# Patient Record
Sex: Female | Born: 1995 | Race: White | Hispanic: Yes | Marital: Single | Smoking: Never smoker
Health system: Southern US, Community
[De-identification: ages and names within clinical notes are randomized; demographics above are authoritative.]

## PROBLEM LIST (undated history)

## (undated) DIAGNOSIS — R109 Unspecified abdominal pain: Secondary | ICD-10-CM

## (undated) HISTORY — DX: Unspecified abdominal pain: R10.9

## (undated) HISTORY — PX: TONSILLECTOMY: SUR1361

---
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
## 2013-01-21 ENCOUNTER — Telehealth: Payer: Self-pay | Admitting: Gastroenterology

## 2013-01-21 NOTE — Telephone Encounter (Signed)
Message copied by Arna Snipe on Wed Jan 21, 2013  2:54 PM ------      Message from: Donata Duff      Created: Wed Dec 24, 2012  9:27 AM       Do not bill ------

## 2013-02-24 ENCOUNTER — Ambulatory Visit (INDEPENDENT_AMBULATORY_CARE_PROVIDER_SITE_OTHER): Payer: BC Managed Care – PPO | Admitting: Family Medicine

## 2013-02-24 ENCOUNTER — Encounter (INDEPENDENT_AMBULATORY_CARE_PROVIDER_SITE_OTHER): Payer: Self-pay

## 2013-02-24 ENCOUNTER — Encounter: Payer: Self-pay | Admitting: Family Medicine

## 2013-02-24 VITALS — BP 98/61 | HR 79 | Temp 98.7°F | Ht 67.0 in | Wt 238.0 lb

## 2013-02-24 DIAGNOSIS — L282 Other prurigo: Secondary | ICD-10-CM

## 2013-02-24 MED ORDER — PREDNISONE 50 MG PO TABS: ORAL_TABLET | ORAL | Status: DC

## 2013-02-24 NOTE — Progress Notes (Signed)
  Subjective:    Patient ID: Peggy Ford, female    DOB: [birth year], 17 y.o.   MRN: [id number]  HPI  This patient complains of a RASH  Location: face/cheek area   Onset: 2-3 days   Course: was out camping 1-2 days prior to onset. Developed facial rash. Pruritic in nature. Had similar episode in the past with outdoor exposure. Denies any direct poison ivy exposure. No prior hx/o rosacea or acne. Prioe hx/o ? Allergies. No known hx/o eczema   Self-treated with: nothing   Improvement with treatment: nothing   History  Itching: yes  Tenderness: no  New medications/antibiotics: no  Pet exposure: no  Recent travel or tropical exposure: no  New soaps, shampoos, detergent, clothing: no  Tick/insect exposure: no  Chemical Exposure: no  Red Flags  Feeling ill: no  Fever: no  Facial/tongue swelling/difficulty breathing: no  Diabetic or immunocompromised: no      Review of Systems  All other systems reviewed and are negative.       Objective:   Physical Exam  Constitutional: She appears well-developed and well-nourished.  HENT:  Head: Normocephalic.    Right Ear: External ear normal.  Left Ear: External ear normal.  Mouth/Throat: Oropharynx is clear and moist.  + faint erythematous rash over bilateral cheeks and R upper eyebrow/eye lid.  Eye full ROM  Vision intact   Eyes: Conjunctivae are normal. Pupils are equal, round, and reactive to light.  Neck: Normal range of motion. Neck supple.  Cardiovascular: Normal rate and regular rhythm.   Pulmonary/Chest: Effort normal.  Abdominal: Soft.  Musculoskeletal: Normal range of motion.  Neurological: She is alert.  Skin: Skin is warm.          Assessment & Plan:  Pruritic rash - Plan: predniSONE (DELTASONE) 50 MG tablet  DDx for rash is fairly broad including contact dermatitis, eczema, acne, rosacea.  Will place on short course of prednisone for treatment Discussed general and derm red flags.  Follow up as needed.

## 2013-08-11 ENCOUNTER — Telehealth: Payer: Self-pay | Admitting: Family Medicine

## 2013-08-11 NOTE — Telephone Encounter (Signed)
appt scheduled on wed with mae

## 2013-08-19 ENCOUNTER — Encounter: Payer: Self-pay | Admitting: General Practice

## 2013-08-19 ENCOUNTER — Ambulatory Visit (INDEPENDENT_AMBULATORY_CARE_PROVIDER_SITE_OTHER): Payer: BC Managed Care – PPO | Admitting: General Practice

## 2013-08-19 ENCOUNTER — Ambulatory Visit (INDEPENDENT_AMBULATORY_CARE_PROVIDER_SITE_OTHER): Payer: BC Managed Care – PPO

## 2013-08-19 VITALS — BP 107/67 | HR 79 | Temp 98.3°F | Ht 67.0 in | Wt 239.4 lb

## 2013-08-19 DIAGNOSIS — R110 Nausea: Secondary | ICD-10-CM

## 2013-08-19 DIAGNOSIS — R109 Unspecified abdominal pain: Secondary | ICD-10-CM

## 2013-08-19 DIAGNOSIS — N390 Urinary tract infection, site not specified: Secondary | ICD-10-CM

## 2013-08-19 DIAGNOSIS — K5900 Constipation, unspecified: Secondary | ICD-10-CM

## 2013-08-19 DIAGNOSIS — R7989 Other specified abnormal findings of blood chemistry: Secondary | ICD-10-CM

## 2013-08-19 LAB — POCT URINALYSIS DIPSTICK
Bilirubin, UA: NEGATIVE
GLUCOSE UA: NEGATIVE
KETONES UA: NEGATIVE
Nitrite, UA: POSITIVE
PROTEIN UA: NEGATIVE
SPEC GRAV UA: 1.01
UROBILINOGEN UA: NEGATIVE
pH, UA: 7.5

## 2013-08-19 LAB — POCT CBC
GRANULOCYTE PERCENT: 65.5 % (ref 37–80)
HCT, POC: 44 % (ref 37.7–47.9)
Hemoglobin: 14.1 g/dL (ref 12.2–16.2)
LYMPH, POC: 3 (ref 0.6–3.4)
MCH, POC: 26.7 pg — AB (ref 27–31.2)
MCHC: 32.1 g/dL (ref 31.8–35.4)
MCV: 83.3 fL (ref 80–97)
MPV: 8.1 fL (ref 0–99.8)
PLATELET COUNT, POC: 295 10*3/uL (ref 142–424)
POC GRANULOCYTE: 6 (ref 2–6.9)
POC LYMPH PERCENT: 32.4 %L (ref 10–50)
RBC: 5.3 M/uL (ref 4.04–5.48)
RDW, POC: 14.4 %
WBC: 9.2 10*3/uL (ref 4.6–10.2)

## 2013-08-19 LAB — POCT UA - MICROSCOPIC ONLY
CASTS, UR, LPF, POC: NEGATIVE
CRYSTALS, UR, HPF, POC: NEGATIVE
MUCUS UA: NEGATIVE
Yeast, UA: NEGATIVE

## 2013-08-19 MED ORDER — NITROFURANTOIN MONOHYD MACRO 100 MG PO CAPS: 100.0000 mg | ORAL_CAPSULE | Freq: Two times a day (BID) | ORAL | Status: DC

## 2013-08-19 NOTE — Patient Instructions (Signed)
Urinary Tract Infection  Urinary tract infections (UTIs) can develop anywhere along your urinary tract. Your urinary tract is your body's drainage system for removing wastes and extra water. Your urinary tract includes two kidneys, two ureters, a bladder, and a urethra. Your kidneys are a pair of bean-shaped organs. Each kidney is about the size of your fist. They are located below your ribs, one on each side of your spine.  CAUSES  Infections are caused by microbes, which are microscopic organisms, including fungi, viruses, and bacteria. These organisms are so small that they can only be seen through a microscope. Bacteria are the microbes that most commonly cause UTIs.  SYMPTOMS   Symptoms of UTIs may vary by age and gender of the patient and by the location of the infection. Symptoms in young women typically include a frequent and intense urge to urinate and a painful, burning feeling in the bladder or urethra during urination. Older women and men are more likely to be tired, shaky, and weak and have muscle aches and abdominal pain. A fever may mean the infection is in your kidneys. Other symptoms of a kidney infection include pain in your back or sides below the ribs, nausea, and vomiting.  DIAGNOSIS  To diagnose a UTI, your caregiver will ask you about your symptoms. Your caregiver also will ask to provide a urine sample. The urine sample will be tested for bacteria and white blood cells. White blood cells are made by your body to help fight infection.  TREATMENT   Typically, UTIs can be treated with medication. Because most UTIs are caused by a bacterial infection, they usually can be treated with the use of antibiotics. The choice of antibiotic and length of treatment depend on your symptoms and the type of bacteria causing your infection.  HOME CARE INSTRUCTIONS   If you were prescribed antibiotics, take them exactly as your caregiver instructs you. Finish the medication even if you feel better after you  have only taken some of the medication.   Drink enough water and fluids to keep your urine clear or pale yellow.   Avoid caffeine, tea, and carbonated beverages. They tend to irritate your bladder.   Empty your bladder often. Avoid holding urine for long periods of time.   Empty your bladder before and after sexual intercourse.   After a bowel movement, women should cleanse from front to back. Use each tissue only once.  SEEK MEDICAL CARE IF:    You have back pain.   You develop a fever.   Your symptoms do not begin to resolve within 3 days.  SEEK IMMEDIATE MEDICAL CARE IF:    You have severe back pain or lower abdominal pain.   You develop chills.   You have nausea or vomiting.   You have continued burning or discomfort with urination.  MAKE SURE YOU:    Understand these instructions.   Will watch your condition.   Will get help right away if you are not doing well or get worse.  Document Released: 01/31/2005 Document Revised: 10/23/2011 Document Reviewed: 06/01/2011  Exit[employer] Patient Information 2014 [employer].

## 2013-08-19 NOTE — Progress Notes (Signed)
Subjective:    Patient ID: Peggy Ford, female    DOB: 14-Jul-1995, 18 y.o.   MRN: [id number]  HPI Patient presents today with complaints of right side pain, nausea, diarrhea, heartburn. History of same symptoms and seen by GI specialists. She is accompanied by her mother. Denies being seen by GI since 2014. Mother to call and inform worsening of symptoms. Last menstrual cycle was August 02, 2013 and regular.     Review of Systems  Constitutional: Negative for fever and chills.  Respiratory: Negative for chest tightness and shortness of breath.   Cardiovascular: Negative for chest pain and palpitations.  Gastrointestinal: Positive for nausea and diarrhea. Negative for vomiting and abdominal pain.       Objective:   Physical Exam  Constitutional: She is oriented to person, place, and time. She appears well-developed and well-nourished.  Cardiovascular: Normal rate, regular rhythm and normal heart sounds.   Pulmonary/Chest: Effort normal and breath sounds normal. No respiratory distress. She exhibits no tenderness.  Abdominal: Soft. Bowel sounds are normal. She exhibits no distension. There is no tenderness.  Neurological: She is alert and oriented to person, place, and time.  Skin: Skin is warm and dry.  Psychiatric: She has a normal mood and affect.    [facility] reading (PRIMARY) by Erby Pian, FNP-C, moderate amount of stool noted in colon.   Results for orders placed in visit on 08/19/13  POCT UA - MICROSCOPIC ONLY      Result Value Ref Range   WBC, Ur, HPF, POC 15-20     RBC, urine, microscopic 10-15     Bacteria, U Microscopic mod     Mucus, UA neg     Epithelial cells, urine per micros occ     Crystals, Ur, HPF, POC neg     Casts, Ur, LPF, POC neg     Yeast, UA neg    POCT URINALYSIS DIPSTICK      Result Value Ref Range   Color, UA yellow     Clarity, UA cloudy     Glucose, UA neg     Bilirubin, UA neg     Ketones, UA neg     Spec Grav, UA 1.010     Blood,  UA mod     pH, UA 7.5     Protein, UA neg     Urobilinogen, UA negative     Nitrite, UA pos     Leukocytes, UA moderate (2+)    POCT CBC      Result Value Ref Range   WBC 9.2  4.6 - 10.2 K/uL   Lymph, poc 3.0  0.6 - 3.4   POC LYMPH PERCENT 32.4  10 - 50 %L   POC Granulocyte 6.0  2 - 6.9   Granulocyte percent 65.5  37 - 80 %G   RBC 5.3  4.04 - 5.48 M/uL   Hemoglobin 14.1  12.2 - 16.2 g/dL   HCT, POC 44.0  37.7 - 47.9 %   MCV 83.3  80 - 97 fL   MCH, POC 26.7 (*) 27 - 31.2 pg   MCHC 32.1  31.8 - 35.4 g/dL   RDW, POC 14.4     Platelet Count, POC 295.0  142 - 424 K/uL   MPV 8.1  0 - 99.8 fL       Assessment & Plan:  1. Side pain  - POCT CBC - DG Abd 1 View; Future  2. Abnormal CBC   3. Nausea  alone  - POCT UA - Microscopic Only - POCT urinalysis dipstick  4. Constipation -Miralax 17grams daily, for 1-4 days, until bowel movement  Increase fluid intake (water) Increase fiber in diet (fruits, vegetables, whole grains) Take stool softner daily   5. UTI (urinary tract infection)  - nitrofurantoin, macrocrystal-monohydrate, (MACROBID) 100 MG capsule; Take 1 capsule (100 mg total) by mouth 2 (two) times daily.  Dispense: 20 capsule; Refill: 0 Increase fluid intake AZO over the counter X2 days Frequent voiding Proper perineal hygiene RTO prn Patient verbalized understanding Erby Pian, FNP-C

## 2014-05-24 ENCOUNTER — Ambulatory Visit (INDEPENDENT_AMBULATORY_CARE_PROVIDER_SITE_OTHER): Payer: BLUE CROSS/BLUE SHIELD | Admitting: Family Medicine

## 2014-05-24 ENCOUNTER — Encounter: Payer: Self-pay | Admitting: Family Medicine

## 2014-05-24 VITALS — BP 112/70 | HR 93 | Temp 97.5°F | Ht 67.0 in | Wt 250.6 lb

## 2014-05-24 DIAGNOSIS — J029 Acute pharyngitis, unspecified: Secondary | ICD-10-CM

## 2014-05-24 LAB — POCT RAPID STREP A (OFFICE): RAPID STREP A SCREEN: NEGATIVE

## 2014-05-24 NOTE — Progress Notes (Signed)
   Subjective:    Patient ID: Peggy Ford, female    DOB: 24-Sep-1995, 19 y.o.   MRN: [id number]  HPI  Patient is here today for a sore throat that started on 2 days ago.  She has also had fever and headache. Frontal HA. Pressure in ears. Denies cough, sputum and dyspnea.   There is mild nasal congestion. The sore thoat hurts with swallow. Moderate severity.   Review of Systems  Constitutional: Negative for fever, chills, diaphoresis, appetite change and fatigue.  HENT: Positive for ear pain and trouble swallowing. Negative for congestion, hearing loss, postnasal drip and rhinorrhea.   Respiratory: Negative for cough, chest tightness and shortness of breath.   Cardiovascular: Negative for chest pain and palpitations.  Gastrointestinal: Negative for abdominal pain.  Musculoskeletal: Negative for arthralgias.  Skin: Negative for rash.       Objective:   Physical Exam  Constitutional: She is oriented to person, place, and time. She appears well-developed and well-nourished. No distress.  HENT:  Head: Normocephalic and atraumatic.  Right Ear: Tympanic membrane and external ear normal. No decreased hearing is noted.  Left Ear: Tympanic membrane and external ear normal. No decreased hearing is noted.  Nose: Nose normal. Right sinus exhibits no frontal sinus tenderness. Left sinus exhibits no frontal sinus tenderness.  Mouth/Throat: Oropharynx is clear and moist. No oropharyngeal exudate or posterior oropharyngeal erythema.  Eyes: Conjunctivae and EOM are normal. Pupils are equal, round, and reactive to light.  Neck: Normal range of motion. Neck supple. No Brudzinski's sign noted. No thyromegaly present.  Cardiovascular: Normal rate, regular rhythm and normal heart sounds.   No murmur heard. Pulmonary/Chest: Effort normal and breath sounds normal. No respiratory distress. She has no wheezes. She has no rales.  Abdominal: Soft. Bowel sounds are normal. She exhibits no distension. There is  no tenderness.  Lymphadenopathy:       Head (right side): No preauricular adenopathy present.       Head (left side): No preauricular adenopathy present.    She has no cervical adenopathy.       Right cervical: No superficial cervical adenopathy present.      Left cervical: No superficial cervical adenopathy present.  Neurological: She is alert and oriented to person, place, and time. She has normal reflexes.  Skin: Skin is warm and dry.  Psychiatric: She has a normal mood and affect. Her behavior is normal. Judgment and thought content normal.   BP 112/70 mmHg  Pulse 93  Temp(Src) 97.5 F (36.4 C) (Oral)  Ht 5\' 7"  (1.702 m)  Wt 250 lb 9.6 oz (113.671 kg)  BMI 39.24 kg/m2  LMP 05/10/2014        Assessment & Plan:  Sore throat - Plan: POCT rapid strep A  Acute pharyngitis, unspecified pharyngitis type  Throat culture ordered. Instruction for care given.

## 2014-05-24 NOTE — Patient Instructions (Signed)

## 2014-05-26 LAB — UPPER RESPIRATORY CULTURE, ROUTINE

## 2015-07-05 IMAGING — NM NM HEPATO W/GB/PHARM/[PERSON_NAME]
2 series · 12 of 12 positions shown · non-contrast
Comparison: 10/28/2012

CLINICAL DATA: Right upper quadrant pain

NUCLEAR MEDICINE HEPATOBILIARY IMAGING WITH GALLBLADDER EF
TECHNIQUE: Sequential images of the abdomen were obtained [DATE] minutes following intravenous administration of
radiopharmaceutical.  After slow intravenous infusion of
micrograms Cholecystokinin, gallbladder ejection fraction was
determined.
Radiopharmaceutical:  5.5 mCi Nc-11m Choletec

[Series 1: he hepato · 4.75mm/px · 6 of 60 frames shown (1 of 2)]
[frame 6/60]
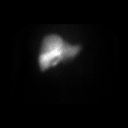
[frame 16/60]
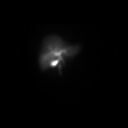
[frame 26/60]
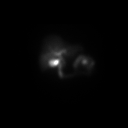
[frame 36/60]
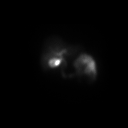
[frame 46/60]
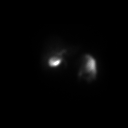
[frame 56/60]
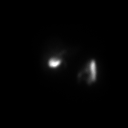

[Series 1: he hepato · 4.75mm/px · 6 of 30 frames shown (2 of 2)]
[frame 3/30]
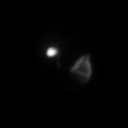
[frame 8/30]
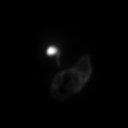
[frame 13/30]
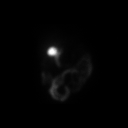
[frame 18/30]
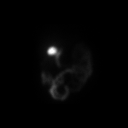
[frame 23/30]
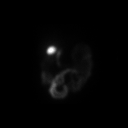
[frame 28/30]
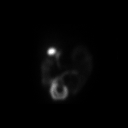

[12 of 12 positions shown; findings below may reference images not displayed]

FINDINGS: Normal hepatic uptake and excretion of the radiotracer
into the biliary tract.  Gallbladder is well visualized beginning
at 10 minutes.  Activity progresses into the small bowel.
Therefore, the cystic duct and common bile duct are patent.  No
biliary obstruction.

CCK administered for gallbladder stimulation.  Ejection fraction
calculated after 30 minutes measuring 51%, which is within normal
limits.

The patient did not experience symptoms during CCK infusion.
IMPRESSION: Normal hepatobiliary scan

## 2015-09-20 ENCOUNTER — Other Ambulatory Visit (INDEPENDENT_AMBULATORY_CARE_PROVIDER_SITE_OTHER): Payer: BLUE CROSS/BLUE SHIELD

## 2015-09-20 ENCOUNTER — Ambulatory Visit (INDEPENDENT_AMBULATORY_CARE_PROVIDER_SITE_OTHER): Payer: BLUE CROSS/BLUE SHIELD | Admitting: Gastroenterology

## 2015-09-20 ENCOUNTER — Encounter: Payer: Self-pay | Admitting: Gastroenterology

## 2015-09-20 VITALS — BP 98/66 | HR 92 | Ht 67.0 in | Wt 260.1 lb

## 2015-09-20 DIAGNOSIS — R109 Unspecified abdominal pain: Secondary | ICD-10-CM | POA: Diagnosis not present

## 2015-09-20 LAB — TSH: TSH: 2.33 u[IU]/mL (ref 0.35–4.50)

## 2015-09-20 LAB — COMPREHENSIVE METABOLIC PANEL
ALBUMIN: 4.5 g/dL (ref 3.5–5.2)
ALK PHOS: 78 U/L (ref 39–117)
ALT: 11 U/L (ref 0–35)
AST: 13 U/L (ref 0–37)
BILIRUBIN TOTAL: 0.4 mg/dL (ref 0.2–1.2)
BUN: 9 mg/dL (ref 6–23)
CO2: 27 mEq/L (ref 19–32)
Calcium: 9.4 mg/dL (ref 8.4–10.5)
Chloride: 103 mEq/L (ref 96–112)
Creatinine, Ser: 0.71 mg/dL (ref 0.40–1.20)
GFR: 109.97 mL/min (ref >60.00)
GLUCOSE: 95 mg/dL (ref 70–99)
Potassium: 4.2 mEq/L (ref 3.5–5.1)
SODIUM: 137 meq/L (ref 135–145)
TOTAL PROTEIN: 7.7 g/dL (ref 6.0–8.3)

## 2015-09-20 LAB — CBC WITH DIFFERENTIAL/PLATELET
BASOS ABS: 0.1 10*3/uL (ref 0.0–0.1)
Basophils Relative: 0.5 % (ref 0.0–3.0)
EOS ABS: 0.2 10*3/uL (ref 0.0–0.7)
Eosinophils Relative: 1 % (ref 0.0–5.0)
HCT: 39.4 % (ref 36.0–46.0)
Hemoglobin: 13.1 g/dL (ref 12.0–15.0)
LYMPHS ABS: 4.2 10*3/uL — AB (ref 0.7–4.0)
Lymphocytes Relative: 28.7 % (ref 12.0–46.0)
MCHC: 33.4 g/dL (ref 30.0–36.0)
MCV: 82.7 fl (ref 78.0–100.0)
MONO ABS: 1.1 10*3/uL — AB (ref 0.1–1.0)
Monocytes Relative: 7.1 % (ref 3.0–12.0)
NEUTROS PCT: 62.7 % (ref 43.0–77.0)
Neutro Abs: 9.3 10*3/uL — ABNORMAL HIGH (ref 1.4–7.7)
Platelets: 378 10*3/uL (ref 150.0–400.0)
RBC: 4.76 Mil/uL (ref 3.87–5.11)
RDW: 14.7 % (ref 11.5–15.5)

## 2015-09-20 LAB — IGA: IGA: 135 mg/dL (ref 68–378)

## 2015-09-20 NOTE — Patient Instructions (Addendum)
You will have labs checked today in the basement lab.  Please head down after you check out with the front desk  (cmet, cbc, tsh, total IgA level, IgA related tTG). You will be set up for a CT scan of abdomen and pelvis with IV and oral contrast (for upper and lower abdominal pains). You have been scheduled for a CT scan of the abdomen and pelvis at [place] ([address]---this is in the same building as Press [facility]).   You are scheduled on 09/21/15 at 1130 am. You should arrive 15 minutes prior to your appointment time for registration. Please follow the written instructions below on the day of your exam:  WARNING: IF YOU ARE ALLERGIC TO IODINE/X-RAY DYE, PLEASE NOTIFY RADIOLOGY IMMEDIATELY AT [phone]! YOU WILL BE GIVEN A 13 HOUR PREMEDICATION PREP.  1) Do not eat or drink anything after 730 am (4 hours prior to your test) 2) You have been given 2 bottles of oral contrast to drink. The solution may taste better if refrigerated, but do NOT add ice or any other liquid to this solution. Shake well before drinking.    Drink 1 bottle of contrast @ 930 am (2 hours prior to your exam)  Drink 1 bottle of contrast @ 1030 am (1 hour prior to your exam)  You may take any medications as prescribed with a small amount of water except for the following: Metformin, Glucophage, Glucovance, Avandamet, Riomet, Fortamet, Actoplus Met, Janumet, Glumetza or Metaglip. The above medications must be held the day of the exam AND 48 hours after the exam.  The purpose of you drinking the oral contrast is to aid in the visualization of your intestinal tract. The contrast solution may cause some diarrhea. Before your exam is started, you will be given a small amount of fluid to drink. Depending on your individual set of symptoms, you may also receive an intravenous injection of x-ray contrast/dye. Plan on being at [facility] for 30 minutes or longer, depending on the type of exam you are  having performed.  This test typically takes 30-45 minutes to complete.  If you have any questions regarding your exam or if you need to reschedule, you may call the CT department at [phone] between the hours of 8:00 am and 5:00 pm, Monday-Friday.  ________________________________________________________________________

## 2015-09-20 NOTE — Progress Notes (Signed)
Review of pertinent gastrointestinal problems: 1. Epig, RUQ pains: 2014: EXTREME NSAID USE (30 ibuprofen daily); EGD 10/2012 Dr. Ardis Hughs was normal;  [place] 11/2012 was essentially normal. HIDA 11/2012 was normal.   HPI: This is a   pleasant 20 year old woman    who was referred to me by Chipper Herb, MD  to evaluate  upper, lower abdominal pains, nausea, diarrhea .    Chief complaint is upper, lower abdominal pains, nausea, diarrhea  She has flares of pains in lower abd, pains in RUQ.  These come on usually after red meats, oily foods.  Avoiding those foods would be helpful.  Usually every 2-3 weeks or so.  She can have nausea at the same time. Was taking OTC ranitidine was taking this several times per day.   Also complains of diarrhea, this usually helped her pains.  This depends on diet.  Usually twice per week.  Oily, greasy foods make it worse.  Steady weight.  Takes only tylenol.  mentral cramps have improved.  These are the same pains as 3 years ago.  Just occuring more often and lasting longer.   Review of systems: Pertinent positive and negative review of systems were noted in the above HPI section. Complete review of systems was performed and was otherwise normal.   History reviewed. No pertinent past medical history.  Past Surgical History  Procedure Laterality Date  . Toenail excision  2010  . Tonsillectomy      No current outpatient prescriptions on file.   No current facility-administered medications for this visit.    Allergies as of 09/20/2015  . (No Known Allergies)    Family History  Problem Relation Age of Onset  . Heart disease      great gm  . Diabetes      great gf  . Colon cancer      great gm  . Irritable bowel syndrome Maternal Grandmother   . Diverticulosis Paternal Grandfather     Social History   Social History  . Marital Status: Single    Spouse Name: N/A  . Number of Children: N/A  . Years of Education: N/A   Occupational  History  . Not on file.   Social History Main Topics  . Smoking status: Never Smoker   . Smokeless tobacco: Never Used  . Alcohol Use: No  . Drug Use: No  . Sexual Activity: Not on file   Other Topics Concern  . Not on file   Social History Narrative     Physical Exam: BP 98/66 mmHg  Pulse 92  Ht 5\' 7"  (1.702 m)  Wt 260 lb 2 oz (117.992 kg)  BMI 40.73 kg/m2  LMP 08/30/2015 Constitutional: generally well-appearing Psychiatric: alert and oriented x3 Eyes: extraocular movements intact Mouth: oral pharynx moist, no lesions Neck: supple no lymphadenopathy Cardiovascular: heart regular rate and rhythm Lungs: clear to auscultation bilaterally Abdomen: soft, nontender, nondistended, no obvious ascites, no peritoneal signs, normal bowel sounds Extremities: no lower extremity edema bilaterally Skin: no lesions on visible extremities   Assessment and plan: 20 y.o. female with  Chronic upper, lower abdominal pains, nausea, diarrhea  Unclear etiology. She tells me these at the same symptoms that she was having 3-4 years ago during which she had ultrasound, HIDA scan, upper endoscopy without clear etiology. At that time she was taking an extraordinary amount of NSAIDs on a nearly daily basis. I recommended stopping NSAIDs. She did in her pains improved bit but seem to have recurred  and are more intense now. I sense that this is probably functional like to get some blood tests, CT scan abdomen and pelvis for her upper and lower abdominal pains. She might need colonoscopy if the above testing is not helpful.   Owens Loffler, MD [facility] 09/20/2015, 3:43 PM  Cc: Chipper Herb, MD

## 2015-09-21 ENCOUNTER — Ambulatory Visit (INDEPENDENT_AMBULATORY_CARE_PROVIDER_SITE_OTHER)
Admission: RE | Admit: 2015-09-21 | Discharge: 2015-09-21 | Disposition: A | Discharge status: Home / Self Care | Payer: BLUE CROSS/BLUE SHIELD | Source: Ambulatory Visit | Attending: Gastroenterology | Admitting: Gastroenterology

## 2015-09-21 DIAGNOSIS — R109 Unspecified abdominal pain: Secondary | ICD-10-CM | POA: Diagnosis not present

## 2015-09-21 LAB — TISSUE TRANSGLUTAMINASE, IGA: Tissue Transglutaminase Ab, IgA: 1 U/mL (ref <4)

## 2015-09-21 MED ORDER — IOPAMIDOL (ISOVUE-300) INJECTION 61%
100.0000 mL | Freq: Once | INTRAVENOUS | Status: AC | PRN
  Administered 2015-09-21: 100 mL via INTRAVENOUS

## 2015-10-14 ENCOUNTER — Ambulatory Visit (AMBULATORY_SURGERY_CENTER): Payer: Self-pay | Admitting: *Deleted

## 2015-10-14 ENCOUNTER — Encounter: Payer: Self-pay | Admitting: Gastroenterology

## 2015-10-14 VITALS — Ht 67.0 in | Wt 261.6 lb

## 2015-10-14 DIAGNOSIS — R109 Unspecified abdominal pain: Secondary | ICD-10-CM

## 2015-10-14 MED ORDER — SUPREP BOWEL PREP KIT 17.5-3.13-1.6 GM/177ML PO SOLN: 1.0000 | Freq: Once | ORAL | Status: DC

## 2015-10-14 NOTE — Progress Notes (Signed)
Patient denies any allergies to egg or soy products. Patient denies complications with anesthesia/sedation.  Patient denies oxygen use at home and denies diet medications. Emmi instructions for colonoscopy/endoscopy explained but patient denied.  Pamphlet given.

## 2015-10-19 ENCOUNTER — Ambulatory Visit (AMBULATORY_SURGERY_CENTER): Payer: BLUE CROSS/BLUE SHIELD | Admitting: Gastroenterology

## 2015-10-19 ENCOUNTER — Encounter: Payer: Self-pay | Admitting: Gastroenterology

## 2015-10-19 VITALS — BP 105/59 | HR 69 | Temp 98.5°F | Resp 26 | Ht 67.0 in | Wt 261.0 lb

## 2015-10-19 DIAGNOSIS — R197 Diarrhea, unspecified: Secondary | ICD-10-CM

## 2015-10-19 DIAGNOSIS — D123 Benign neoplasm of transverse colon: Secondary | ICD-10-CM

## 2015-10-19 DIAGNOSIS — R109 Unspecified abdominal pain: Secondary | ICD-10-CM

## 2015-10-19 MED ORDER — SODIUM CHLORIDE 0.9 % IV SOLN: 500.0000 mL | INTRAVENOUS | Status: DC

## 2015-10-19 NOTE — Progress Notes (Signed)
Called to room to assist during endoscopic procedure.  Patient ID and intended procedure confirmed with present staff. Received instructions for my participation in the procedure from the performing physician.  

## 2015-10-19 NOTE — Progress Notes (Signed)
To pacu vss patent aw reprot to rn 

## 2015-10-19 NOTE — Patient Instructions (Signed)

## 2015-10-19 NOTE — Op Note (Signed)
[place] Patient Name: Peggy Ford Procedure Date: 10/19/2015 1:54 PM MRN: [id number] Endoscopist: [name] , MD Age: 20 Referring MD:  Date of Birth: 06/12/1995 Gender: Female Procedure:                Upper GI endoscopy Indications:              Epigastric abdominal pain Medicines:                Monitored Anesthesia Care Procedure:                Pre-Anesthesia Assessment:                           - Prior to the procedure, a History and Physical                            was performed, and patient medications and                            allergies were reviewed. The patient's tolerance of                            previous anesthesia was also reviewed. The risks                            and benefits of the procedure and the sedation                            options and risks were discussed with the patient.                            All questions were answered, and informed consent                            was obtained. Prior Anticoagulants: The patient has                            taken no previous anticoagulant or antiplatelet                            agents. ASA Grade Assessment: II - A patient with                            mild systemic disease. After reviewing the risks                            and benefits, the patient was deemed in                            satisfactory condition to undergo the procedure.                           After obtaining informed consent, the endoscope was  passed under direct vision. Throughout the                            procedure, the patient's blood pressure, pulse, and                            oxygen saturations were monitored continuously. The                            Model GIF-[id number]) scope was introduced                            through the mouth, and advanced to the second part                            of duodenum. The upper GI endoscopy was                     accomplished without difficulty. The patient                            tolerated the procedure well. Scope In: Scope Out: Findings:                 The esophagus was normal.                           The stomach was normal.                           The examined duodenum was normal. Complications:            No immediate complications. Estimated blood loss:                            None. Estimated Blood Loss:     Estimated blood loss: none. Impression:               - Normal esophagus.                           - Normal stomach.                           - Normal examined duodenum.                           - No specimens collected. Recommendation:           - Patient has a contact number available for                            emergencies. The signs and symptoms of potential                            delayed complications were discussed with the                            patient. Return to normal activities  tomorrow.                            Written discharge instructions were provided to the                            patient.                           - Resume previous diet.                           - Continue present medications.                           - No repeat upper endoscopy.                           - Await pathology from recent colonoscopy biopsies. [name], MD 10/19/2015 2:19:49 PM This report has been signed electronically.

## 2015-10-19 NOTE — Op Note (Signed)
[place] Patient Name: Peggy Ford Procedure Date: 10/19/2015 1:59 PM MRN: [id number] Endoscopist: [name] , MD Age: 20 Referring MD:  Date of Birth: 12/30/1995 Gender: Female Procedure:                Colonoscopy Indications:              Clinically significant diarrhea of unexplained                            origin, abdominal pains Medicines:                Monitored Anesthesia Care Procedure:                Pre-Anesthesia Assessment:                           - Prior to the procedure, a History and Physical                            was performed, and patient medications and                            allergies were reviewed. The patient's tolerance of                            previous anesthesia was also reviewed. The risks                            and benefits of the procedure and the sedation                            options and risks were discussed with the patient.                            All questions were answered, and informed consent                            was obtained. Prior Anticoagulants: The patient has                            taken no previous anticoagulant or antiplatelet                            agents. ASA Grade Assessment: II - A patient with                            mild systemic disease. After reviewing the risks                            and benefits, the patient was deemed in                            satisfactory condition to undergo the procedure.  After obtaining informed consent, the colonoscope                            was passed under direct vision. Throughout the                            procedure, the patient's blood pressure, pulse, and                            oxygen saturations were monitored continuously. The                            Model CF-HQ190L [id number]) scope was introduced                            through the anus and advanced to the the terminal                    ileum. The colonoscopy was performed without                            difficulty. The patient tolerated the procedure                            well. The quality of the bowel preparation was                            excellent. The terminal ileum, ileocecal valve,                            appendiceal orifice, and rectum were photographed. Scope In: 2:02:38 PM Scope Out: 2:10:56 PM Scope Withdrawal Time: 0 hours 5 minutes 56 seconds  Total Procedure Duration: 0 hours 8 minutes 18 seconds  Findings:                 The perianal and digital rectal examinations were                            normal.                           A 3 mm polyp was found in the transverse colon. The                            polyp was sessile. The polyp was removed with a                            cold snare. Resection and retrieval were complete.                           The exam was otherwise without abnormality on                            direct and retroflexion views.  Biopsies for histology were taken with a cold                            forceps from the entire colon for evaluation of                            microscopic colitis. Complications:            No immediate complications. Estimated blood loss:                            None. Estimated Blood Loss:     Estimated blood loss: none. Impression:               - One 3 mm polyp in the transverse colon, removed                            with a cold snare. Resected and retrieved.                           - The examination was otherwise normal on direct                            and retroflexion views.                           - Biopsies were taken with a cold forceps from the                            entire colon for evaluation of microscopic colitis. Recommendation:           - Patient has a contact number available for                            emergencies. The signs and symptoms of potential                             delayed complications were discussed with the                            patient. Return to normal activities tomorrow.                            Written discharge instructions were provided to the                            patient.                           - Resume previous diet.                           - Continue present medications.                           - Await pathology results. [name], MD 10/19/2015 2:14:07  PM This report has been signed electronically.

## 2015-10-20 ENCOUNTER — Telehealth: Payer: Self-pay

## 2015-10-20 NOTE — Telephone Encounter (Signed)
Left message on answering machine. 

## 2015-10-25 ENCOUNTER — Encounter: Payer: Self-pay | Admitting: Gastroenterology

## 2016-01-26 ENCOUNTER — Ambulatory Visit (INDEPENDENT_AMBULATORY_CARE_PROVIDER_SITE_OTHER): Payer: BLUE CROSS/BLUE SHIELD | Admitting: Family Medicine

## 2016-01-26 ENCOUNTER — Encounter: Payer: Self-pay | Admitting: Family Medicine

## 2016-01-26 DIAGNOSIS — F329 Major depressive disorder, single episode, unspecified: Secondary | ICD-10-CM | POA: Diagnosis not present

## 2016-01-26 DIAGNOSIS — F418 Other specified anxiety disorders: Secondary | ICD-10-CM | POA: Insufficient documentation

## 2016-01-26 DIAGNOSIS — F32A Depression, unspecified: Secondary | ICD-10-CM

## 2016-01-26 MED ORDER — CITALOPRAM HYDROBROMIDE 20 MG PO TABS: 20.0000 mg | ORAL_TABLET | Freq: Every day | ORAL | 3 refills | Status: DC

## 2016-01-26 NOTE — Progress Notes (Signed)
   HPI  Patient presents today here with depression.  Patient explains that she's had steadily worsening symptoms over the last 1 year.  She has primarily feeling down and depressed, anhedonia, trouble falling asleep and then sleeping over 12 hours with continued fatigue, or appetite, and passive thoughts of being better off dead.  She states that she is not planning to hurt herself and would not hurt herself, however she has considered cutting herself with a razor blade.  She contracts for safety.  She has tried counseling with not much improvement, she would like to try medications.  PMH: Smoking status noted ROS: Per HPI  Objective: BP (!) 127/91   Pulse (!) 106   Temp 98.9 F (37.2 C) (Oral)   Ht 5\' 7"  (1.702 m)   Wt 250 lb 6.4 oz (113.6 kg)   BMI 39.22 kg/m  Gen: NAD, alert, cooperative with exam HEENT: NCAT, EOMI, PERRL Abd: SNTND, BS present, no guarding or organomegaly Ext: No edema, warm Neuro: Alert and oriented, No gross deficits  Psych: His affect, denies current suicidal thoughts but does endorse feelings of being better off dead occasionally, states that she would not hurt herself and she contracts for safety.  Depression screen PHQ 2/9 01/26/2016  Decreased Interest 2  Down, Depressed, Hopeless 2  PHQ - 2 Score 4  Altered sleeping 3  Tired, decreased energy 2  Change in appetite 2  Feeling bad or failure about yourself  3  Trouble concentrating 1  Moving slowly or fidgety/restless 1  Suicidal thoughts 2  PHQ-9 Score 18     Assessment and plan:  # Depression Significant depression for one year Start citalopram, considred wellbutrin but pt has too much anxiety and I believe this may exacerbate her symps Encouraged patient to pursue CBT. Recommended 3 to four-week follow-up, patient contracts for safety   Meds ordered this encounter  Medications  . etonogestrel (NEXPLANON) 68 MG IMPL implant    Sig: 1 each by Subdermal route once.  .  citalopram (CELEXA) 20 MG tablet    Sig: Take 1 tablet (20 mg total) by mouth daily.    Dispense:  30 tablet    Refill:  [place], MD [facility] 01/26/2016, 3:10 PM

## 2016-01-26 NOTE — Patient Instructions (Addendum)
Great to meet you!  I have started yo uon citalopram for depression, it will also help with anxiety.   I think you would get benefit from Cognitive behavioral therapy (CBT), Look at [email or web address] for local therapists/psychologists   Taking the medicine as directed and not missing any doses is one of the best things you can do to treat your depression.  Here are some things to keep in mind:  1) Side effects (stomach upset, some increased anxiety) may happen before you notice a benefit.  These side effects typically go away over time. 2) Changes to your dose of medicine or a change in medication all together is sometimes necessary 3) Most people need to be on medication at least 6-12 months 4) Many people will notice an improvement within two weeks but the full effect of the medication can take up to 4-6 weeks 5) Stopping the medication when you start feeling better often results in a return of symptoms 6) If you start having thoughts of hurting yourself or others after starting this medicine, please call  [phone] immediately

## 2016-02-06 ENCOUNTER — Ambulatory Visit (INDEPENDENT_AMBULATORY_CARE_PROVIDER_SITE_OTHER): Payer: BLUE CROSS/BLUE SHIELD | Admitting: Family

## 2016-02-06 ENCOUNTER — Encounter: Payer: Self-pay | Admitting: Family

## 2016-02-06 VITALS — BP 112/72 | HR 91 | Temp 98.1°F | Ht 67.0 in | Wt 251.8 lb

## 2016-02-06 DIAGNOSIS — J301 Allergic rhinitis due to pollen: Secondary | ICD-10-CM

## 2016-02-06 DIAGNOSIS — J069 Acute upper respiratory infection, unspecified: Secondary | ICD-10-CM | POA: Diagnosis not present

## 2016-02-06 MED ORDER — FLUTICASONE PROPIONATE 50 MCG/ACT NA SUSP: 2.0000 | Freq: Every day | NASAL | 6 refills | Status: DC

## 2016-02-06 NOTE — Patient Instructions (Signed)
Upper Respiratory Infection, Adult Most upper respiratory infections (URIs) are a viral infection of the air passages leading to the lungs. A URI affects the nose, throat, and upper air passages. The most common type of URI is nasopharyngitis and is typically referred to as "the common cold." URIs run their course and usually go away on their own. Most of the time, a URI does not require medical attention, but sometimes a bacterial infection in the upper airways can follow a viral infection. This is called a secondary infection. Sinus and middle ear infections are common types of secondary upper respiratory infections. Bacterial pneumonia can also complicate a URI. A URI can worsen asthma and chronic obstructive pulmonary disease (COPD). Sometimes, these complications can require emergency medical care and may be life threatening.  CAUSES Almost all URIs are caused by viruses. A virus is a type of germ and can spread from one person to another.  RISKS FACTORS You may be at risk for a URI if:   You smoke.   You have chronic heart or lung disease.  You have a weakened defense (immune) system.   You are very young or very old.   You have nasal allergies or asthma.  You work in crowded or poorly ventilated areas.  You work in health care facilities or schools. SIGNS AND SYMPTOMS  Symptoms typically develop 2-3 days after you come in contact with a cold virus. Most viral URIs last 7-10 days. However, viral URIs from the influenza virus (flu virus) can last 14-18 days and are typically more severe. Symptoms may include:   Runny or stuffy (congested) nose.   Sneezing.   Cough.   Sore throat.   Headache.   Fatigue.   Fever.   Loss of appetite.   Pain in your forehead, behind your eyes, and over your cheekbones (sinus pain).  Muscle aches.  DIAGNOSIS  Your health care provider may diagnose a URI by:  Physical exam.  Tests to check that your symptoms are not due to  another condition such as:  Strep throat.  Sinusitis.  Pneumonia.  Asthma. TREATMENT  A URI goes away on its own with time. It cannot be cured with medicines, but medicines may be prescribed or recommended to relieve symptoms. Medicines may help:  Reduce your fever.  Reduce your cough.  Relieve nasal congestion. HOME CARE INSTRUCTIONS   Take medicines only as directed by your health care provider.   Gargle warm saltwater or take cough drops to comfort your throat as directed by your health care provider.  Use a warm mist humidifier or inhale steam from a shower to increase air moisture. This may make it easier to breathe.  Drink enough fluid to keep your urine clear or pale yellow.   Eat soups and other clear broths and maintain good nutrition.   Rest as needed.   Return to work when your temperature has returned to normal or as your health care provider advises. You may need to stay home longer to avoid infecting others. You can also use a face mask and careful hand washing to prevent spread of the virus.  Increase the usage of your inhaler if you have asthma.   Do not use any tobacco products, including cigarettes, chewing tobacco, or electronic cigarettes. If you need help quitting, ask your health care provider. PREVENTION  The best way to protect yourself from getting a cold is to practice good hygiene.   Avoid oral or hand contact with people with cold   symptoms.   Wash your hands often if contact occurs.  There is no clear evidence that vitamin C, vitamin E, echinacea, or exercise reduces the chance of developing a cold. However, it is always recommended to get plenty of rest, exercise, and practice good nutrition.  SEEK MEDICAL CARE IF:   You are getting worse rather than better.   Your symptoms are not controlled by medicine.   You have chills.  You have worsening shortness of breath.  You have brown or red mucus.  You have yellow or brown nasal  discharge.  You have pain in your face, especially when you bend forward.  You have a fever.  You have swollen neck glands.  You have pain while swallowing.  You have white areas in the back of your throat. SEEK IMMEDIATE MEDICAL CARE IF:   You have severe or persistent:  Headache.  Ear pain.  Sinus pain.  Chest pain.  You have chronic lung disease and any of the following:  Wheezing.  Prolonged cough.  Coughing up blood.  A change in your usual mucus.  You have a stiff neck.  You have changes in your:  Vision.  Hearing.  Thinking.  Mood. MAKE SURE YOU:   Understand these instructions.  Will watch your condition.  Will get help right away if you are not doing well or get worse.   This information is not intended to replace advice given to you by your health care provider. Make sure you discuss any questions you have with your health care provider.   Document Released: 10/17/2000 Document Revised: 09/07/2014 Document Reviewed: 07/29/2013 Elsevier Interactive Patient Education 2016 Elsevier Inc.  

## 2016-02-06 NOTE — Progress Notes (Signed)
Subjective:    Patient ID: Peggy Ford, female    DOB: [birth year], 20 y.o.   MRN: [id number]  Otalgia   There is pain in both ears. This is a new problem. The current episode started in the past 7 days. The problem occurs constantly. There has been no fever. The pain is at a severity of 2/10. The pain is mild. Associated symptoms include headaches, hearing loss, rhinorrhea and a sore throat. Pertinent negatives include no coughing, diarrhea, ear discharge, neck pain or vomiting. She has tried acetaminophen (benadryl) for the symptoms. The treatment provided mild relief.      Review of Systems  HENT: Positive for ear pain, hearing loss, rhinorrhea and sore throat. Negative for ear discharge.   Respiratory: Negative for cough.   Gastrointestinal: Negative for diarrhea and vomiting.  Musculoskeletal: Negative for neck pain.  Neurological: Positive for headaches.  All other systems reviewed and are negative.      Objective:   Physical Exam  Constitutional: She is oriented to person, place, and time. She appears well-developed and well-nourished. No distress.  HENT:  Head: Normocephalic and atraumatic.  Right Ear: External ear normal.  Left Ear: External ear normal.  Nose: Mucosal edema and rhinorrhea present. Right sinus exhibits no maxillary sinus tenderness and no frontal sinus tenderness. Left sinus exhibits no maxillary sinus tenderness and no frontal sinus tenderness.  Mouth/Throat: Posterior oropharyngeal erythema present.  Eyes: Pupils are equal, round, and reactive to light.  Neck: Normal range of motion. Neck supple. No thyromegaly present.  Cardiovascular: Normal rate, regular rhythm, normal heart sounds and intact distal pulses.   No murmur heard. Pulmonary/Chest: Effort normal and breath sounds normal. No respiratory distress. She has no wheezes.  Abdominal: Soft. Bowel sounds are normal. She exhibits no distension. There is no tenderness.  Musculoskeletal: Normal  range of motion. She exhibits no edema or tenderness.  Neurological: She is alert and oriented to person, place, and time. She has normal reflexes. No cranial nerve deficit.  Skin: Skin is warm and dry.  Psychiatric: She has a normal mood and affect. Her behavior is normal. Judgment and thought content normal.  Vitals reviewed.     BP 112/72 (BP Location: Left Arm, Patient Position: Sitting, Cuff Size: Large)   Pulse 91   Temp 98.1 F (36.7 C) (Oral)   Ht 5\' 7"  (1.702 m)   Wt 251 lb 12.8 oz (114.2 kg)   LMP 01/23/2016 (Approximate)   BMI 39.44 kg/m      Assessment & Plan:  1. Acute upper respiratory infection -- Take meds as prescribed - Use a cool mist humidifier  -Use saline nose sprays frequently -Saline irrigations of the nose can be very helpful if done frequently.  * 4X daily for 1 week*  * Use of a nettie pot can be helpful with this. Follow directions with this* -Force fluids -For any cough or congestion  Use plain Mucinex- regular strength or max strength is fine   * Children- consult with Pharmacist for dosing -For fever or aces or pains- take tylenol or ibuprofen appropriate for age and weight.  * for fevers greater than 101 orally you may alternate ibuprofen and tylenol every  3 hours. -Throat lozenges if help -New toothbrush in 3 days - fluticasone (FLONASE) 50 MCG/ACT nasal spray; Place 2 sprays into both nostrils daily.  Dispense: 16 g; Refill: 6  2. Acute allergic rhinitis due to pollen, unspecified seasonality -Start zyrtec and flonase -Avoid allergens  - fluticasone (  FLONASE) 50 MCG/ACT nasal spray; Place 2 sprays into both nostrils daily.  Dispense: 16 g; Refill: [place], FNP

## 2016-02-24 ENCOUNTER — Ambulatory Visit (INDEPENDENT_AMBULATORY_CARE_PROVIDER_SITE_OTHER): Payer: BLUE CROSS/BLUE SHIELD | Admitting: Family Medicine

## 2016-02-24 ENCOUNTER — Encounter: Payer: Self-pay | Admitting: Family Medicine

## 2016-02-24 VITALS — BP 116/74 | HR 88 | Temp 97.6°F | Ht 67.0 in | Wt 249.4 lb

## 2016-02-24 DIAGNOSIS — F32A Depression, unspecified: Secondary | ICD-10-CM

## 2016-02-24 DIAGNOSIS — F329 Major depressive disorder, single episode, unspecified: Secondary | ICD-10-CM | POA: Diagnosis not present

## 2016-02-24 NOTE — Progress Notes (Addendum)
   HPI  Patient presents today here for follow-up of depression.  Patient started Celexa after office visit about one month She had significant depression with a PHQ-9 score of 18.   She denies si He cant identify any side effects She is very satisfied with the mediction.   PMH: Smoking status noted ROS: Per HPI  Objective: BP 116/74   Pulse 88   Temp 97.6 F (36.4 C) (Oral)   Ht 5\' 7"  (1.702 m)   Wt 249 lb 6.4 oz (113.1 kg)   LMP 01/23/2016 (Approximate)   BMI 39.06 kg/m  Gen: NAD, alert, cooperative with exam HEENT: NCAT CV: RRR, good S1/S2, no murmur Resp: CTABL, no wheezes, non-labored Ext: No edema, warm Neuro: Alert and oriented, No gross deficits  Psych:  No SI, appropriate mood and affect  Depression screen [facility] 2/9 02/24/2016 02/06/2016 01/26/2016  Decreased Interest 0 1 2  Down, Depressed, Hopeless 0 1 2  PHQ - 2 Score 0 2 4  Altered sleeping 2 3 3   Tired, decreased energy 1 3 2   Change in appetite 0 2 2  Feeling bad or failure about yourself  0 3 3  Trouble concentrating 0 0 1  Moving slowly or fidgety/restless 0 1 1  Suicidal thoughts 0 0 2  PHQ-9 Score 3 14 18   Difficult doing work/chores Somewhat difficult Somewhat difficult -     Assessment and plan:  # Depression Improving Tolerating Celexa well, continue.  Follow up in 2 months, then q 4-6 months for routine f/u depression.    Laroy Apple, MD [facility] 02/24/2016, 8:14 AM

## 2016-04-27 ENCOUNTER — Encounter: Payer: Self-pay | Admitting: Family Medicine

## 2016-04-27 ENCOUNTER — Encounter (INDEPENDENT_AMBULATORY_CARE_PROVIDER_SITE_OTHER): Payer: Self-pay

## 2016-04-27 ENCOUNTER — Ambulatory Visit (INDEPENDENT_AMBULATORY_CARE_PROVIDER_SITE_OTHER): Payer: BLUE CROSS/BLUE SHIELD | Admitting: Family Medicine

## 2016-04-27 VITALS — BP 102/66 | HR 74 | Temp 97.8°F | Ht 67.0 in | Wt 239.2 lb

## 2016-04-27 DIAGNOSIS — F329 Major depressive disorder, single episode, unspecified: Secondary | ICD-10-CM | POA: Diagnosis not present

## 2016-04-27 DIAGNOSIS — F32A Depression, unspecified: Secondary | ICD-10-CM

## 2016-04-27 MED ORDER — BUSPIRONE HCL 7.5 MG PO TABS: 7.5000 mg | ORAL_TABLET | Freq: Two times a day (BID) | ORAL | 2 refills | Status: DC

## 2016-04-27 NOTE — Progress Notes (Signed)
   HPI  Patient presents today here to follow-up for depression.  Patient is tolerating citalopram very well. She denies any side effects but states that lately she has had increasing anxiety. She states that she's had at least 1 panic attack and had to leave work for this.  She denies any suicidal thoughts.  She is very happy with the medication and does not want to stop.  Patient is starting an exercise routine. She complains of severe fatigue afterwards, she has not been exercising at all previously. We discussed reducing the amount of time and increasing consistency and slowly increasing over time.  PMH: Smoking status noted ROS: Per HPI  Objective: BP 102/66   Pulse 74   Temp 97.8 F (36.6 C) (Oral)   Ht 5\' 7"  (1.702 m)   Wt 239 lb 3.2 oz (108.5 kg)   BMI 37.46 kg/m  Gen: NAD, alert, cooperative with exam HEENT: NCAT CV: RRR, good S1/S2, no murmur Resp: CTABL, no wheezes, non-labored Ext: No edema, warm Neuro: Alert and oriented, No gross deficits Depression screen [facility] 2/9 04/27/2016 02/24/2016 02/06/2016 01/26/2016  Decreased Interest 0 0 1 2  Down, Depressed, Hopeless 0 0 1 2  PHQ - 2 Score 0 0 2 4  Altered sleeping - 2 3 3   Tired, decreased energy - 1 3 2   Change in appetite - 0 2 2  Feeling bad or failure about yourself  - 0 3 3  Trouble concentrating - 0 0 1  Moving slowly or fidgety/restless - 0 1 1  Suicidal thoughts - 0 0 2  PHQ-9 Score - 3 14 18   Difficult doing work/chores - Somewhat difficult Somewhat difficult -   GAD 7 : Generalized Anxiety Score 04/27/2016  Nervous, Anxious, on Edge 3  Control/stop worrying 3  Worry too much - different things 3  Trouble relaxing 2  Restless 3  Easily annoyed or irritable 3  Afraid - awful might happen 1  Total GAD 7 Score 18  Anxiety Difficulty Somewhat difficult      Assessment and plan:  # Depression Improved with Celexa, however now anxiety seems to be more an issue. Continue 20 mg Celexa, adding on  BuSpar Return to clinic in 2-3 months unless needed sooner. I discussed the patient's possible side effects and dose titration if needed in 2-3 weeks she will call in for this.   Meds ordered this encounter  Medications  . busPIRone (BUSPAR) 7.5 MG tablet    Sig: Take 1 tablet (7.5 mg total) by mouth 2 (two) times daily.    Dispense:  60 tablet    Refill:  [place], MD [facility] 04/27/2016, 8:12 AM

## 2016-04-27 NOTE — Patient Instructions (Signed)
Great to see you!  I have started you on buspar, 1 pill twice daily. Continue celexa 1 pill once daily

## 2016-05-18 ENCOUNTER — Other Ambulatory Visit: Payer: Self-pay | Admitting: Family Medicine

## 2016-05-31 ENCOUNTER — Ambulatory Visit (INDEPENDENT_AMBULATORY_CARE_PROVIDER_SITE_OTHER): Payer: BLUE CROSS/BLUE SHIELD | Admitting: Family Medicine

## 2016-05-31 ENCOUNTER — Encounter: Payer: Self-pay | Admitting: Family Medicine

## 2016-05-31 ENCOUNTER — Telehealth: Payer: Self-pay | Admitting: Pediatrics

## 2016-05-31 VITALS — BP 113/70 | HR 94 | Temp 97.6°F | Ht 67.0 in | Wt 246.2 lb

## 2016-05-31 DIAGNOSIS — F329 Major depressive disorder, single episode, unspecified: Secondary | ICD-10-CM

## 2016-05-31 DIAGNOSIS — F32A Depression, unspecified: Secondary | ICD-10-CM

## 2016-05-31 MED ORDER — CITALOPRAM HYDROBROMIDE 40 MG PO TABS: 40.0000 mg | ORAL_TABLET | Freq: Every day | ORAL | 3 refills | Status: DC

## 2016-05-31 NOTE — Progress Notes (Signed)
   HPI  Patient presents today here to follow-up for depression and anxiety.  Patient states that she's tolerated BuSpar easily. She's been tolerating Celexa very well until she started BuSpar. She noticed that her depression slightly worsened after a couple weeks of treatment with BuSpar. She denies any suicidal thoughts.  She states it was not very effective for anxiety.  She's not sleeping well. She does have a family history of depression and bipolar disorder.  PMH: Smoking status noted ROS: Per HPI  Objective: BP 113/70   Pulse 94   Temp 97.6 F (36.4 C) (Oral)   Ht 5\' 7"  (1.702 m)   Wt 246 lb 3.2 oz (111.7 kg)   BMI 38.56 kg/m  Gen: NAD, alert, cooperative with exam HEENT: NCAT CV: RRR, good S1/S2, no murmur Resp: CTABL, no wheezes, non-labored Ext: No edema, warm Neuro: Alert and oriented, No gross deficits  Assessment and plan:  # Depression With anxiety, depression worsened with BuSpar DC BuSpar, increase Celexa to 40 mg Refer to psychiatry, discussed with patient follow-up in 6-8 weeks either with me or psychiatry. Also if any side effects follow up sooner. Other considerations could be adding gabapentin to SSRI for anxiolytic affect, adding Seroquel at night, or Abilify.     Meds ordered this encounter  Medications  . citalopram (CELEXA) 40 MG tablet    Sig: Take 1 tablet (40 mg total) by mouth daily.    Dispense:  30 tablet    Refill:  [place], MD [facility] 05/31/2016, 4:38 PM

## 2016-05-31 NOTE — Patient Instructions (Signed)
Great to see you!  Your provider wants you to schedule an appointment with a Psychologist/Psychiatrist. The following list of offices requires the patient to call and make their own appointment, as there is information they need that only you can provide. Please feel free to choose form the following providers:  [place] in [place]  [place]  [phone] [place], [place]  (Scheduled through [place]) Must call and do an interview for appointment. Sees Children / Accepts Medicaid  [facility] [place]  [address] [place]    [place], [place]  [phone] [place], [place] for Autism but does not treat it Sees Children / Accepts Medicaid  [facility]    [phone] [address] [place], [place] Medication management, substance abuse, bipolar, grief, family, marriage, OCD, anxiety, PTSD Sees children / Accepts Medicaid  [facility]    [phone] [address] [place], [place] children / Accepts [facility]  [facility]  [phone] [address] [place], [place]   Dr Lorenza Evangelist     [phone] [address] [place], [place]  Sees ADD & ADHD for treatment Accepts Medicaid  [place]  [phone] Arlean Hopping, [place] for Autism Accepts [facility]  [facility]   [phone] [place], [place]  Does Adult ADD evaluations Does not accept Medicaid  [facility]   [phone] [place], [place] therapy  Sees children as young as 67 years old Accepts Medicaid

## 2016-06-01 NOTE — Telephone Encounter (Signed)
noted 

## 2016-06-28 ENCOUNTER — Ambulatory Visit: Payer: BLUE CROSS/BLUE SHIELD | Admitting: Family Medicine

## 2016-07-27 ENCOUNTER — Ambulatory Visit: Payer: BLUE CROSS/BLUE SHIELD | Admitting: Family Medicine

## 2016-11-22 ENCOUNTER — Ambulatory Visit (INDEPENDENT_AMBULATORY_CARE_PROVIDER_SITE_OTHER): Payer: BLUE CROSS/BLUE SHIELD | Admitting: Family Medicine

## 2016-11-22 ENCOUNTER — Encounter: Payer: Self-pay | Admitting: Family Medicine

## 2016-11-22 VITALS — BP 116/67 | HR 80 | Temp 100.0°F | Ht 67.0 in | Wt 242.0 lb

## 2016-11-22 DIAGNOSIS — F32A Depression, unspecified: Secondary | ICD-10-CM

## 2016-11-22 DIAGNOSIS — F329 Major depressive disorder, single episode, unspecified: Secondary | ICD-10-CM | POA: Diagnosis not present

## 2016-11-22 MED ORDER — CITALOPRAM HYDROBROMIDE 40 MG PO TABS: 40.0000 mg | ORAL_TABLET | Freq: Every day | ORAL | 1 refills | Status: DC

## 2016-11-22 NOTE — Progress Notes (Signed)
   HPI  Patient presents today here to follow-up for depression.  Patient states that she was doing pretty well on 40 mg of Celexa. She denies suicidal thoughts. She ran out of medication about one month ago, she states that she can tell. The difference and would like to restart the medication.  BuSpar previously worsened her symptoms.  She would like to see a psychiatrist, she requests the psychiatrist list provided again.  PMH: Smoking status noted ROS: Per HPI  Objective: BP 116/67   Pulse 80   Temp 100 F (37.8 C) (Oral)   Ht 5\' 7"  (1.702 m)   Wt 242 lb (109.8 kg)   BMI 37.90 kg/m  Gen: NAD, alert, cooperative with exam HEENT: NCAT, EOMI, PERRL CV: RRR, good S1/S2, no murmur Resp: CTABL, no wheezes, non-labored Neuro: Alert and oriented, No gross deficits Depression screen [facility] 2/9 11/22/2016 05/31/2016 04/27/2016 02/24/2016 02/06/2016  Decreased Interest 1 2 0 0 1  Down, Depressed, Hopeless 2 1 0 0 1  PHQ - 2 Score 3 3 0 0 2  Altered sleeping 3 2 - 2 3  Tired, decreased energy 3 2 - 1 3  Change in appetite 3 3 - 0 2  Feeling bad or failure about yourself  2 1 - 0 3  Trouble concentrating 0 1 - 0 0  Moving slowly or fidgety/restless 2 1 - 0 1  Suicidal thoughts 0 0 - 0 0  PHQ-9 Score 16 13 - 3 14  Difficult doing work/chores Somewhat difficult Somewhat difficult - Somewhat difficult Somewhat difficult     Assessment and plan:  # Depression Worsening off of medication, restart Celexa 40 mg. Recommended 20 mg 8 days and then increase to 40 mg Patient has considered and would like to see psychiatry, list of local providers given. No SI. Follow-up 6 months and will establish with psychiatry  Meds ordered this encounter  Medications  . citalopram (CELEXA) 40 MG tablet    Sig: Take 1 tablet (40 mg total) by mouth daily.    Dispense:  90 tablet    Refill:  [place], MD [facility] 11/22/2016, 4:20 PM

## 2016-11-22 NOTE — Patient Instructions (Signed)
Great to see you!  Take a look over the list below and check one of the psychiatrists out.   Start celexa 1/2 pill for 8 days, then increase to 1 pill daily  Your provider wants you to schedule an appointment with a Psychologist/Psychiatrist. The following list of offices requires the patient to call and make their own appointment, as there is information they need that only you can provide. Please feel free to choose form the following providers:  [place] in [place]  [place]  [phone] [place], [place]  (Scheduled through [place]) Must call and do an interview for appointment. Sees Children / Accepts Medicaid  [facility] in [place]  [address] [place]    [place], [place]  [phone] [place], [place] for Autism but does not treat it Sees Children / Accepts Medicaid  [facility]    [phone] [address] [place], [place] Medication management, substance abuse, bipolar, grief, family, marriage, OCD, anxiety, PTSD Sees children / Accepts Medicaid  [facility]    [phone] [address] [place], [place] children / Accepts [facility]  [facility]  [phone] [address] [place], [place]   Dr Lorenza Evangelist     [phone] [address] [place], [place]  Sees ADD & ADHD for treatment Accepts Medicaid  [facility]  [phone] [address] Arlean Hopping, [place] for Autism Accepts [facility]  [facility]  [phone] [place], [place]  Does Adult ADD evaluations Does not accept Medicaid  [facility]   [phone] [place], [place] children as young as 100 years old Accepts  [facility]     [phone]    [place], [place] [address] Sees children Accepts Medicaid

## 2017-06-03 ENCOUNTER — Ambulatory Visit (INDEPENDENT_AMBULATORY_CARE_PROVIDER_SITE_OTHER): Payer: 59 | Admitting: Family Medicine

## 2017-06-03 ENCOUNTER — Encounter: Payer: Self-pay | Admitting: Family Medicine

## 2017-06-03 VITALS — BP 133/82 | HR 98 | Temp 97.8°F | Ht 67.0 in | Wt 272.0 lb

## 2017-06-03 DIAGNOSIS — F32A Depression, unspecified: Secondary | ICD-10-CM

## 2017-06-03 DIAGNOSIS — F329 Major depressive disorder, single episode, unspecified: Secondary | ICD-10-CM | POA: Diagnosis not present

## 2017-06-03 MED ORDER — CITALOPRAM HYDROBROMIDE 20 MG PO TABS: ORAL_TABLET | ORAL | 0 refills | Status: DC

## 2017-06-03 MED ORDER — BUPROPION HCL ER (SR) 150 MG PO TB12: 150.0000 mg | ORAL_TABLET | Freq: Two times a day (BID) | ORAL | 2 refills | Status: DC

## 2017-06-03 NOTE — Patient Instructions (Signed)
Great to see you!  Come [place] in 3-4 weeks  Discontinue citalopram by taking 20 mg once daily for 2 weeks then 10 mg once daily for 2 weeks then stop.  Start Wellbutrin by taking 1 tablet daily for 3 days, then increase to 1 tablet twice daily

## 2017-06-03 NOTE — Progress Notes (Signed)
   HPI  Patient presents today here for follow-up depression.  Patient has mixed depression and anxiety. Has been on citalopram for about 6 months, she states that the beginning she could tell an improvement and now feels that it is no longer helpful. She is having feelings of anxiety and feelings of depression on a daily basis. She is not sleeping that well. She complains of decreased energy, difficulty sleeping, trouble concentrating.  She denies SI.  She wants to avoid addictive medications  PMH: Smoking status noted ROS: Per HPI  Objective: BP 133/82   Pulse 98   Temp 97.8 F (36.6 C) (Oral)   Ht 5\' 7"  (1.702 m)   Wt 272 lb (123.4 kg)   BMI 42.60 kg/m  Gen: NAD, alert, cooperative with exam HEENT: NCAT CV: RRR, good S1/S2, no murmur Resp: CTABL, no wheezes, non-labored Ext: No edema, warm  Neuro: Alert and oriented, No gross deficits  Assessment and plan:  #Depression With some anxious feelings of Wellbutrin, I did caution her that she may have more feelings of anxiety at first with Wellbutrin. If symptoms are too bad we will send small amount of Ativan if needed 3-4 weeks follow up   Meds ordered this encounter  Medications  . buPROPion (WELLBUTRIN SR) 150 MG 12 hr tablet    Sig: Take 1 tablet (150 mg total) by mouth 2 (two) times daily.    Dispense:  60 tablet    Refill:  2  . citalopram (CELEXA) 20 MG tablet    Sig: 1 tab daily for 2 weeks then 1/2 tab daily for 2 weeks then stop    Dispense:  21 tablet    Refill:  0    Laroy Apple, MD [facility] 06/03/2017, 4:58 PM

## 2017-06-25 ENCOUNTER — Ambulatory Visit: Payer: 59 | Admitting: Family Medicine

## 2017-06-26 ENCOUNTER — Ambulatory Visit (INDEPENDENT_AMBULATORY_CARE_PROVIDER_SITE_OTHER): Payer: 59 | Admitting: Family Medicine

## 2017-06-26 ENCOUNTER — Encounter: Payer: Self-pay | Admitting: Family Medicine

## 2017-06-26 VITALS — BP 123/77 | HR 104 | Temp 97.5°F | Ht 67.0 in | Wt 280.8 lb

## 2017-06-26 DIAGNOSIS — F32A Depression, unspecified: Secondary | ICD-10-CM

## 2017-06-26 DIAGNOSIS — F329 Major depressive disorder, single episode, unspecified: Secondary | ICD-10-CM

## 2017-06-26 NOTE — Progress Notes (Signed)
   HPI  Patient presents today for follow-up depression.  Last visit we discontinued her SSRI and started Wellbutrin.  She states that she is tolerating Wellbutrin without a problem.  She denies SI. She does have a little bit worsening difficulty sleeping. She does not see a difference in her mood yet.  PMH: Smoking status noted ROS: Per HPI  Objective: BP 123/77   Pulse (!) 104   Temp (!) 97.5 F (36.4 C) (Oral)   Ht 5\' 7"  (1.702 m)   Wt 280 lb 12.8 oz (127.4 kg)   BMI 43.98 kg/m  Gen: NAD, alert, cooperative with exam HEENT: NCAT CV: RRR, good S1/S2, no murmur Resp: CTABL, no wheezes, non-labored Ext: No edema, warm Neuro: Alert and oriented, No gross deficits  Assessment and plan:  #Depression No change with citalopram transition to Wellbutrin, however only taking now for 3 weeks. Tolerating well Follow-up 2 months. If depression improves and sleep still an issue would consider trazodone, if depression is not significantly improved or if her sleep is too much of a disturbance would consider changing agents   Laroy Apple, MD [facility] 06/26/2017, 5:09 PM

## 2017-06-26 NOTE — Patient Instructions (Signed)
Great to see you!  Come back in 2 months unless youneed [place] sooner.   Continue the current dose of wellbutrin.

## 2017-08-27 ENCOUNTER — Ambulatory Visit: Payer: 59 | Admitting: Family Medicine

## 2017-09-05 ENCOUNTER — Ambulatory Visit (INDEPENDENT_AMBULATORY_CARE_PROVIDER_SITE_OTHER): Payer: 59 | Admitting: Family Medicine

## 2017-09-05 ENCOUNTER — Encounter: Payer: Self-pay | Admitting: Family Medicine

## 2017-09-05 VITALS — BP 132/91 | HR 103 | Temp 99.6°F | Ht 67.0 in | Wt 286.2 lb

## 2017-09-05 DIAGNOSIS — F329 Major depressive disorder, single episode, unspecified: Secondary | ICD-10-CM | POA: Diagnosis not present

## 2017-09-05 DIAGNOSIS — F32A Depression, unspecified: Secondary | ICD-10-CM

## 2017-09-05 MED ORDER — BUPROPION HCL ER (XL) 300 MG PO TB24: 300.0000 mg | ORAL_TABLET | Freq: Every day | ORAL | 1 refills | Status: DC

## 2017-09-05 NOTE — Progress Notes (Signed)
   HPI  Patient presents today for follow up depression.   Pt feels well on wellbutrin but does report an increase in appetite.   She has more energy, feels happier, and is sleeping better.   She has occasionally missed a nighttime dose.   She denies SI She does have a Hx of cutting but has not in over 1 year.   PMH: Smoking status noted ROS: Per HPI  Objective: BP (!) 132/91   Pulse (!) 103   Temp 99.6 F (37.6 C) (Oral)   Ht 5\' 7"  (1.702 m)   Wt 286 lb 3.2 oz (129.8 kg)   BMI 44.83 kg/m  Gen: NAD, alert, cooperative with exam HEENT: NCAT CV: RRR, good S1/S2, no murmur Resp: CTABL, no wheezes, non-labored Ext: No edema, warm Neuro: Alert and oriented, No gross deficits Skin: Healed scars BL arms  Assessment and plan:  # depression Improved No Si Change 150 mg BID to 300 wellbutrin XL F/u 4 months    Meds ordered this encounter  Medications  . buPROPion (WELLBUTRIN XL) 300 MG 24 hr tablet    Sig: Take 1 tablet (300 mg total) by mouth daily.    Dispense:  90 tablet    Refill:  [place], MD [facility] 09/05/2017, 4:30 PM

## 2017-09-05 NOTE — Patient Instructions (Signed)
Great to see you!  Change to wellbutrin 300 mg once daily, Come back in 4 months to see Dr. Lajuana Ripple.

## 2017-09-13 ENCOUNTER — Ambulatory Visit: Payer: 59 | Admitting: Family Medicine

## 2017-10-07 ENCOUNTER — Other Ambulatory Visit: Payer: Self-pay | Admitting: Family Medicine

## 2017-10-07 ENCOUNTER — Ambulatory Visit (INDEPENDENT_AMBULATORY_CARE_PROVIDER_SITE_OTHER): Payer: 59 | Admitting: Physician Assistant

## 2017-10-07 ENCOUNTER — Encounter: Payer: Self-pay | Admitting: Physician Assistant

## 2017-10-07 VITALS — BP 108/73 | HR 82 | Temp 98.1°F | Ht 67.0 in | Wt 277.0 lb

## 2017-10-07 DIAGNOSIS — K047 Periapical abscess without sinus: Secondary | ICD-10-CM

## 2017-10-07 MED ORDER — CEPHALEXIN 500 MG PO CAPS: 500.0000 mg | ORAL_CAPSULE | Freq: Two times a day (BID) | ORAL | 0 refills | Status: DC

## 2017-10-07 NOTE — Patient Instructions (Signed)
Dental Abscess A dental abscess is pus in or around a tooth. Follow these instructions at home:  Take medicines only as told by your dentist.  If you were prescribed antibiotic medicine, finish all of it even if you start to feel better.  Rinse your mouth (gargle) often with salt water.  Do not drive or use heavy machinery, like a lawn mower, while taking pain medicine.  Do not apply heat to the outside of your mouth.  Keep all follow-up visits as told by your dentist. This is important. Contact a doctor if:  Your pain is worse, and medicine does not help. Get help right away if:  You have a fever or chills.  Your symptoms suddenly get worse.  You have a very bad headache.  You have problems breathing or swallowing.  You have trouble opening your mouth.  You have puffiness (swelling) in your neck or around your eye. This information is not intended to replace advice given to you by your health care provider. Make sure you discuss any questions you have with your health care provider. Document Released: 09/07/2014 Document Revised: 09/29/2015 Document Reviewed: 04/20/2014 Elsevier Interactive Patient Education  2018 Elsevier Inc.  

## 2017-10-07 NOTE — Progress Notes (Signed)
  Subjective:     Patient ID: Peggy Ford, female   DOB: 05-27-1995, 22 y.o.   MRN: [id number]  HPI Pt with pain to the L lower molar since Fri Using OTC meds for sx   Review of Systems  Constitutional: Negative.   HENT: Negative.        Objective:   Physical Exam  Constitutional: She appears well-developed and well-nourished. No distress.  HENT:  Mouth/Throat: No oropharyngeal exudate.  No facial edema + erythema and edema to the gum at the L lower molar area No purulent drainage noted + TTP of same  Neck: Neck supple.  Lymphadenopathy:    She has no cervical adenopathy.  Skin: She is not diaphoretic.  Nursing note and vitals reviewed.      Assessment:     1. Tooth abscess        Plan:     Keflex bid x 1 week Salt water gargles Pt to f/u with dentist OTC meds for sx Work note

## 2018-01-07 ENCOUNTER — Ambulatory Visit: Payer: 59 | Admitting: Family Medicine

## 2018-01-14 ENCOUNTER — Encounter: Payer: Self-pay | Admitting: Family Medicine

## 2018-10-30 ENCOUNTER — Other Ambulatory Visit: Payer: Self-pay

## 2018-10-31 ENCOUNTER — Encounter: Payer: Self-pay | Admitting: Family Medicine

## 2018-10-31 ENCOUNTER — Ambulatory Visit (INDEPENDENT_AMBULATORY_CARE_PROVIDER_SITE_OTHER): Payer: 59 | Admitting: Family Medicine

## 2018-10-31 VITALS — BP 124/88 | HR 72 | Temp 99.0°F | Ht 67.0 in | Wt 274.0 lb

## 2018-10-31 DIAGNOSIS — F32A Depression, unspecified: Secondary | ICD-10-CM

## 2018-10-31 DIAGNOSIS — F329 Major depressive disorder, single episode, unspecified: Secondary | ICD-10-CM | POA: Diagnosis not present

## 2018-10-31 DIAGNOSIS — N939 Abnormal uterine and vaginal bleeding, unspecified: Secondary | ICD-10-CM | POA: Diagnosis not present

## 2018-10-31 LAB — BAYER DCA HB A1C WAIVED: HB A1C (BAYER DCA - WAIVED): 4.9 % (ref <7.0)

## 2018-10-31 MED ORDER — BUPROPION HCL ER (SR) 150 MG PO TB12: 150.0000 mg | ORAL_TABLET | Freq: Two times a day (BID) | ORAL | 1 refills | Status: DC

## 2018-10-31 NOTE — Progress Notes (Signed)
Subjective: CC: Depression PCP: [name], DO DJT:TSVXB Peggy Ford is a 23 y.o. female presenting to clinic today for:  1.  Depressive disorder/ abnormal menstrual cycle Patient here for follow-up on depressive disorder.  She reports that this started after the divorce of her parents.  She is always had kind of a low mood but felt that it came to ahead several years ago.  She has never been treated for depression prior to 3 years ago.  She has taken both BuSpar and Lexapro in the past but neither were especially effective in controlling symptoms.  She did have good success with the Wellbutrin.  At her last visit this was changed to 300 mg daily but she notes that it is quite a bit more expensive in the XL version than the SR version she would like to go back to the SR if possible.  She has been out of her medicine since the winter and has noticed a decline in her mood again.  She does report that her mother recently remarried and moved in with her husband.  This has caused a bit of stress as the patient has always lived with her mother and been very close to her.  The patient is now residing alone.  She does feel like she continues to have good social support from her mother.  She has a brother but is not associated with him.  She works at [employer] and has a second job with [employer].  She stays quite busy.  She has 1 dog, a weenie dog.  She is not in a relationship.  She has never been sexually active.  She does have a Nexplanon in place and would like to have this removed as it has caused quite a bit of records with her periods.  She has up to 2 periods per month lasting up to 8 days per menstrual.  Family history is negative for formal diagnosis of depression but she does report that her great grandfather committed suicide and her grandfather seems depressed.   ROS: Per HPI  No Known Allergies Past Medical History:  Diagnosis Date  . Abdominal pain      Current Outpatient Medications:  .  buPROPion (WELLBUTRIN XL) 300 MG 24 hr tablet, Take 1 tablet (300 mg total) by mouth daily., Disp: 90 tablet, Rfl: 1 .  etonogestrel (NEXPLANON) 99 MG IMPL implant, 1 each by Subdermal route once., Disp: , Rfl:  Social History   Socioeconomic History  . Marital status: Single    Spouse name: Not on file  . Number of children: Not on file  . Years of education: Not on file  . Highest education level: Not on file  Occupational History  . Not on file  Social Needs  . Financial resource strain: Not on file  . Food insecurity    Worry: Not on file    Inability: Not on file  . Transportation needs    Medical: Not on file    Non-medical: Not on file  Tobacco Use  . Smoking status: Never Smoker  . Smokeless tobacco: Never Used  Substance and Sexual Activity  . Alcohol use: No    Alcohol/week: 0.0 standard drinks  . Drug use: No  . Sexual activity: Never    Birth control/protection: None  Lifestyle  . Physical activity    Days per week: Not on file    Minutes per session: Not on file  . Stress: Not on file  Relationships  . Social Herbalist on phone: Not on file    Gets together: Not on file    Attends religious service: Not on file    Active member of club or organization: Not on file    Attends meetings of clubs or organizations: Not on file    Relationship status: Not on file  . Intimate partner violence    Fear of current or ex partner: Not on file    Emotionally abused: Not on file    Physically abused: Not on file    Forced sexual activity: Not on file  Other Topics Concern  . Not on file  Social History Narrative  . Not on file   Family History  Problem Relation Age of Onset  . Heart disease Unknown        great gm  . Diabetes Unknown        great gf  . Colon cancer Unknown        great gm  . Irritable bowel syndrome Maternal Grandmother   . Diverticulosis Paternal Grandfather     Objective: Office vital  signs reviewed. BP 124/88   Pulse 72   Temp 99 F (37.2 C) (Oral)   Ht 5' 7"  (1.702 m)   Wt 274 lb (124.3 kg)   BMI 42.91 kg/m   Physical Examination:  General: Awake, alert, well nourished, obese, No acute distress HEENT: Normal, sclera white, no exophthalmos.  No goiter. Cardio: regular rate and rhythm, S1S2 heard, no murmurs appreciated Pulm: clear to auscultation bilaterally, no wheezes, rhonchi or rales; normal work of breathing on room air Extremities: warm, well perfused, No edema, cyanosis or clubbing; +2 pulses bilaterally Skin: normal temperature. Has several tattoos.  Some healed cuts noted along LUE. Psych: mood stable. Affect flat. Pleasant. Interacts with provider.  Depression screen [facility] 2/9 10/31/2018 10/07/2017 09/05/2017  Decreased Interest 2 0 0  Down, Depressed, Hopeless 2 0 0  PHQ - 2 Score 4 0 0  Altered sleeping 1 - -  Tired, decreased energy 3 - -  Change in appetite 1 - -  Feeling bad or failure about yourself  1 - -  Trouble concentrating 3 - -  Moving slowly or fidgety/restless 1 - -  Suicidal thoughts 0 - -  PHQ-9 Score 14 - -  Difficult doing work/chores - - -  Some recent data might be hidden   GAD 7 : Generalized Anxiety Score 10/31/2018 04/27/2016  Nervous, Anxious, on Edge 3 3  Control/stop worrying 2 3  Worry too much - different things 2 3  Trouble relaxing 1 2  Restless 0 3  Easily annoyed or irritable 2 3  Afraid - awful might happen 1 1  Total GAD 7 Score 11 18  Anxiety Difficulty - Somewhat difficult    Assessment/ Plan: 23 y.o. female   1. Depressive disorder Certainly with anxiety.  Her anxiety score has decreased since 2017 but her depression score has increased.  Check TSH and other metabolic labs.  I will reinitiate Wellbutrin.  If she has exacerbation and anxiety component may need to consider addition of SSRI versus Atarax. - TSH - buPROPion (WELLBUTRIN SR) 150 MG 12 hr tablet; Take 1 tablet (150 mg total) by mouth 2 (two)  times daily.  Dispense: 180 tablet; Refill: 1  2. Abnormal uterine bleeding Ongoing since initiation of Nexplanon.  She would like to have this removed.  She will consider alternative forms of birth control and a  handout was provided to her today.  She will set up an appointment to have Nexplanon removed. - TSH - CMP14+EGFR - CBC  3. Morbid obesity (HCC) - TSH - Bayer DCA Hb A1c Waived - buPROPion (WELLBUTRIN SR) 150 MG 12 hr tablet; Take 1 tablet (150 mg total) by mouth 2 (two) times daily.  Dispense: 180 tablet; Refill: 1   No orders of the defined types were placed in this encounter.  No orders of the defined types were placed in this encounter.    [name], DO [place] [phone]

## 2018-10-31 NOTE — Patient Instructions (Signed)

## 2018-11-01 LAB — CBC
Hematocrit: 38.9 % (ref 34.0–46.6)
Hemoglobin: 12.7 g/dL (ref 11.1–15.9)
MCH: 27.5 pg (ref 26.6–33.0)
MCHC: 32.6 g/dL (ref 31.5–35.7)
MCV: 84 fL (ref 79–97)
Platelets: 360 10*3/uL (ref 150–450)
RBC: 4.62 x10E6/uL (ref 3.77–5.28)
RDW: 14 % (ref 11.7–15.4)
WBC: 10 10*3/uL (ref 3.4–10.8)

## 2018-11-01 LAB — CMP14+EGFR
ALT: 15 IU/L (ref 0–32)
AST: 13 IU/L (ref 0–40)
Albumin/Globulin Ratio: 1.7 (ref 1.2–2.2)
Albumin: 4.3 g/dL (ref 3.9–5.0)
Alkaline Phosphatase: 73 IU/L (ref 39–117)
BUN/Creatinine Ratio: 11 (ref 9–23)
BUN: 9 mg/dL (ref 6–20)
Bilirubin Total: 0.3 mg/dL (ref 0.0–1.2)
CO2: 19 mmol/L — ABNORMAL LOW (ref 20–29)
Calcium: 9.1 mg/dL (ref 8.7–10.2)
Chloride: 107 mmol/L — ABNORMAL HIGH (ref 96–106)
Creatinine, Ser: 0.8 mg/dL (ref 0.57–1.00)
GFR calc Af Amer: 119 mL/min/{1.73_m2} (ref >59)
GFR calc non Af Amer: 104 mL/min/{1.73_m2} (ref >59)
Globulin, Total: 2.5 g/dL (ref 1.5–4.5)
Glucose: 89 mg/dL (ref 65–99)
Potassium: 4.2 mmol/L (ref 3.5–5.2)
Sodium: 142 mmol/L (ref 134–144)
Total Protein: 6.8 g/dL (ref 6.0–8.5)

## 2018-11-01 LAB — TSH: TSH: 2.46 u[IU]/mL (ref 0.450–4.500)

## 2018-11-17 ENCOUNTER — Other Ambulatory Visit: Payer: Self-pay

## 2018-11-18 ENCOUNTER — Ambulatory Visit (INDEPENDENT_AMBULATORY_CARE_PROVIDER_SITE_OTHER): Payer: 59 | Admitting: Family Medicine

## 2018-11-18 VITALS — BP 124/75 | HR 85 | Temp 98.1°F | Ht 67.0 in | Wt 274.0 lb

## 2018-11-18 DIAGNOSIS — N939 Abnormal uterine and vaginal bleeding, unspecified: Secondary | ICD-10-CM

## 2018-11-18 DIAGNOSIS — Z3046 Encounter for surveillance of implantable subdermal contraceptive: Secondary | ICD-10-CM | POA: Diagnosis not present

## 2018-11-18 DIAGNOSIS — Z30013 Encounter for initial prescription of injectable contraceptive: Secondary | ICD-10-CM

## 2018-11-18 DIAGNOSIS — Z30430 Encounter for insertion of intrauterine contraceptive device: Secondary | ICD-10-CM

## 2018-11-18 MED ORDER — MEDROXYPROGESTERONE ACETATE 150 MG/ML IM SUSP: 150.0000 mg | INTRAMUSCULAR | 4 refills | Status: DC

## 2018-11-18 MED ORDER — MEDROXYPROGESTERONE ACETATE 150 MG/ML IM SUSY
150.0000 mg | PREFILLED_SYRINGE | INTRAMUSCULAR | Status: DC
  Administered 2018-11-18: 150 mg via INTRAMUSCULAR

## 2018-11-18 NOTE — Progress Notes (Signed)
Subjective: CC: Nexplanon removal PCP: [name], DO [name] is a 23 y.o. female presenting to clinic today for:  1.  Abnormal uterine bleeding Patient with persistent abnormal uterine bleeding with periods lasting over 8 days.  She had a Nexplanon placed in efforts to improve uterine bleeding and protect against contraception.  However, she never became sexually active and this is not helped with uterine bleeding.  She is here today to have this removed.  She plans to use Depoprovera going forward.   ROS: Per HPI  No Known Allergies Past Medical History:  Diagnosis Date  . Abdominal pain     Current Outpatient Medications:  .  buPROPion (WELLBUTRIN SR) 150 MG 12 hr tablet, Take 1 tablet (150 mg total) by mouth 2 (two) times daily., Disp: 180 tablet, Rfl: 1 .  etonogestrel (NEXPLANON) 42 MG IMPL implant, 1 each by Subdermal route once., Disp: , Rfl:  Social History   Socioeconomic History  . Marital status: Single    Spouse name: Not on file  . Number of children: Not on file  . Years of education: Not on file  . Highest education level: Not on file  Occupational History  . Not on file  Social Needs  . Financial resource strain: Not on file  . Food insecurity    Worry: Not on file    Inability: Not on file  . Transportation needs    Medical: Not on file    Non-medical: Not on file  Tobacco Use  . Smoking status: Never Smoker  . Smokeless tobacco: Never Used  Substance and Sexual Activity  . Alcohol use: No    Alcohol/week: 0.0 standard drinks  . Drug use: No  . Sexual activity: Never    Birth control/protection: None  Lifestyle  . Physical activity    Days per week: Not on file    Minutes per session: Not on file  . Stress: Not on file  Relationships  . Social Herbalist on phone: Not on file    Gets together: Not on file    Attends religious service: Not on file    Active member of club or organization: Not on file     Attends meetings of clubs or organizations: Not on file    Relationship status: Not on file  . Intimate partner violence    Fear of current or ex partner: Not on file    Emotionally abused: Not on file    Physically abused: Not on file    Forced sexual activity: Not on file  Other Topics Concern  . Not on file  Social History Narrative  . Not on file   Family History  Problem Relation Age of Onset  . Heart disease Unknown        great gm  . Diabetes Unknown        great gf  . Colon cancer Unknown        great gm  . Irritable bowel syndrome Maternal Grandmother   . Diverticulosis Paternal Grandfather     Objective: Office vital signs reviewed. BP 124/75   Pulse 85   Temp 98.1 F (36.7 C) (Oral)   Ht 5\' 7"  (1.702 m)   Wt 274 lb (124.3 kg)   BMI 42.91 kg/m   Physical Examination:  General: Awake, alert, well nourished, No acute distress Extremities: warm, well perfused, No edema, cyanosis or clubbing; +2 pulses bilaterally; nexplanon palpable in the Left UE.  The risks and benefits of Nexplanon removal was discussed with the patient who has elected to proceed with procedure.  A signed consent was obtained and placed in the medical chart.  The patient denies any allergies to anesthetics or antiseptics.  Procedure: Patient was placed in supine position. The device was palpated and marked.  Her left arm was flexed at the elbow and externally rotated so that her wrist was parallel to her ear. The site was cleaned with Betadine, and the area surrounding the device was covered with a sterile drape. 2 cc of 2% lidocaine with epinephrine was injected just under the device. A scalpel was used to create a small incision.  Fibrous tissue surrounding the device was carefully removed allowing the device to be freed. The device was removed, measured and demonstrated to patient. Hemostasis achieved.  There was less than 3cc of blood loss.  Area was cleaned with rubbing alcohol.   Steri-strips were applied to the incision.  Pressure dressing was applied.  The patient was instructed to removed the pressure dressing in 24 hrs. The patient was advised to move slowly from a supine to an upright position. The patient denied any concerns or complaints.  Home care instructions and strict return precautions (including review of signs and symptoms of infection) were reviewed with patient.  Patient voiced good understanding and was discharged in stable condition home.   Assessment/ Plan: 23 y.o. female   1. Abnormal uterine bleeding (AUB) Nexplanon successfully removed, see above note.  Home care instructions reviewed with patient and handout was provided.  Reasons return discussed.  She elected Depo-Provera as her form of contraception.  This has been sent to her pharmacy.  She will bring the medication in today to have this injected by the nurse.  She will follow-up in 3 months for repeat injection. - medroxyPROGESTERone (DEPO-PROVERA) 150 MG/ML injection; Inject 1 mL (150 mg total) into the muscle every 3 (three) months.  Dispense: 1 mL; Refill: 4  2. Encounter for Nexplanon removal As above.   No orders of the defined types were placed in this encounter.  No orders of the defined types were placed in this encounter.    [name], DO [facility] [phone]

## 2018-11-18 NOTE — Addendum Note (Signed)
Addended by: Marin Olp on: 11/18/2018 03:07 PM   Modules accepted: Orders

## 2018-11-18 NOTE — Patient Instructions (Signed)
NEXPLANON REMOVAL HOME CARE INSTRUCTIONS  You had Nexplanon removed today.   You may notice a small bruise where the device was removed. You may experience mild discomfort for the next 24-48 hours. This is normal. You may have small amounts of bleeding at the insertion site, though bleeding should stop within the next several hours.  You may remove your arm wrap in 24 hours.   You may start showering after 24 hours.  DO NOT take a tub bath, swim, or submerge your body in water for the next 5 days. Leave the Steri-Strips stitches (stickers used to keep your wound closed) on for the next 3-5 days. These should fall off by themselves but if they do not, you may remove them.  Please contact your doctor immediately if: You develop a fever You have worsening pain or swelling You have redness that goes up and down your arm You have pus draining from your insertion site You are bleeding large amounts  

## 2019-02-24 ENCOUNTER — Telehealth: Payer: Self-pay | Admitting: Family Medicine

## 2019-02-24 NOTE — Telephone Encounter (Signed)
Appointment for Mirena insertion scheduled on 04/16/2019 at 2:40 pm.

## 2019-04-15 ENCOUNTER — Telehealth: Payer: Self-pay | Admitting: Family Medicine

## 2019-04-15 ENCOUNTER — Other Ambulatory Visit: Payer: Self-pay

## 2019-04-16 ENCOUNTER — Ambulatory Visit (INDEPENDENT_AMBULATORY_CARE_PROVIDER_SITE_OTHER): Payer: 59 | Admitting: Family Medicine

## 2019-04-16 ENCOUNTER — Encounter: Payer: Self-pay | Admitting: Family Medicine

## 2019-04-16 VITALS — BP 129/82 | HR 77 | Temp 98.9°F | Ht 67.0 in | Wt 289.4 lb

## 2019-04-16 DIAGNOSIS — Z30430 Encounter for insertion of intrauterine contraceptive device: Secondary | ICD-10-CM | POA: Diagnosis not present

## 2019-04-16 DIAGNOSIS — N939 Abnormal uterine and vaginal bleeding, unspecified: Secondary | ICD-10-CM

## 2019-04-16 LAB — PREGNANCY, URINE: Preg Test, Ur: NEGATIVE

## 2019-04-16 MED ORDER — LEVONORGESTREL 20 MCG/24HR IU IUD
1.0000 | INTRAUTERINE_SYSTEM | Freq: Once | INTRAUTERINE | Status: AC
  Administered 2019-04-16: 1 via INTRAUTERINE

## 2019-04-16 NOTE — Progress Notes (Signed)
BP 129/82   Pulse 77   Temp 98.9 F (37.2 C) (Temporal)   Ht 5\' 7"  (1.702 m)   Wt 289 lb 6.4 oz (131.3 kg)   LMP 03/26/2019   SpO2 96%   BMI 45.33 kg/m    Subjective:   Patient ID: Peggy Ford, female    DOB: October 21, 1995, 23 y.o.   MRN: [id number]  HPI: Peggy Ford is a 23 y.o. female presenting on 04/16/2019 for Contraception (Mirena)   HPI Patient has had abnormal uterine bleeding has been on Nexplanon and then Depo-Provera but she forgot to come back for Depo-Provera and she would like an IUD to control her abnormal uterine bleeding.  Patient has never been sexually active.  Relevant past medical, surgical, family and social history reviewed and updated as indicated. Interim medical history since our last visit reviewed. Allergies and medications reviewed and updated.  Review of Systems  Constitutional: Negative for chills and fever.  Eyes: Negative for visual disturbance.  Respiratory: Negative for chest tightness and shortness of breath.   Cardiovascular: Negative for chest pain and leg swelling.  Gastrointestinal: Negative for abdominal pain.  Genitourinary: Positive for menstrual problem.  Musculoskeletal: Negative for back pain and gait problem.  Skin: Negative for rash.  Neurological: Negative for light-headedness and headaches.  Psychiatric/Behavioral: Negative for agitation and behavioral problems.  All other systems reviewed and are negative.   Per HPI unless specifically indicated above   Allergies as of 04/16/2019   No Known Allergies     Medication List       Accurate as of April 16, 2019  3:33 PM. If you have any questions, ask your nurse or doctor.        STOP taking these medications   medroxyPROGESTERone 150 MG/ML injection Commonly known as: DEPO-PROVERA Stopped by: Fransisca Kaufmann Sharanda Shinault, MD     TAKE these medications   buPROPion 150 MG 12 hr tablet Commonly known as: Wellbutrin SR Take 1 tablet (150 mg total) by mouth 2 (two)  times daily.        Objective:   BP 129/82   Pulse 77   Temp 98.9 F (37.2 C) (Temporal)   Ht 5\' 7"  (1.702 m)   Wt 289 lb 6.4 oz (131.3 kg)   LMP 03/26/2019   SpO2 96%   BMI 45.33 kg/m   Wt Readings from Last 3 Encounters:  04/16/19 289 lb 6.4 oz (131.3 kg)  11/18/18 274 lb (124.3 kg)  10/31/18 274 lb (124.3 kg)    Physical Exam Vitals and nursing note reviewed.  Constitutional:      General: She is not in acute distress.    Appearance: She is well-developed. She is not diaphoretic.  Genitourinary:    Vagina: Normal.     Cervix: Normal.     Uterus: Normal.   Neurological:     Mental Status: She is alert and oriented to person, place, and time.     Coordination: Coordination normal.  Psychiatric:        Behavior: Behavior normal.     IUD insertion procedure: Patient was placed in stirrups and use a speculum. Patient was prepped with Betadine swabs using cotton balls. Tenaculum was used to grab the anterior cervix. Uterine sound was performed and found to be 7cm in length and entered. Cervical dilation was necessary with the smallest on the second dilator. Mirena was placed using factory device at the correct depth that was measured and deployed without issue. The strings were  cut leaving 1.5 cm extra. Patient tolerated procedure well and bleeding was minimal.   Assessment & Plan:   Problem List Items Addressed This Visit    None    Visit Diagnoses    Encounter for insertion of intrauterine contraceptive device (IUD)    -  Primary   Relevant Medications   levonorgestrel (MIRENA) 20 MCG/24HR IUD 1 each   Other Relevant Orders   Pregnancy, urine   Abnormal uterine bleeding (AUB)       Relevant Medications   levonorgestrel (MIRENA) 20 MCG/24HR IUD 1 each       Follow up plan: Return in about 6 weeks (around 05/28/2019), or if symptoms worsen or fail to improve, for IUD recheck.  Counseling provided for all of the vaccine components Orders Placed This Encounter   Procedures  . Pregnancy, urine    Caryl Pina, MD [facility] 04/16/2019, 3:33 PM

## 2019-05-27 ENCOUNTER — Ambulatory Visit: Payer: 59 | Admitting: Family Medicine

## 2019-05-27 ENCOUNTER — Encounter: Payer: Self-pay | Admitting: Family Medicine

## 2020-07-27 ENCOUNTER — Ambulatory Visit (INDEPENDENT_AMBULATORY_CARE_PROVIDER_SITE_OTHER): Payer: 59 | Admitting: Family Medicine

## 2020-07-27 ENCOUNTER — Other Ambulatory Visit: Payer: Self-pay

## 2020-07-27 ENCOUNTER — Encounter: Payer: Self-pay | Admitting: Family Medicine

## 2020-07-27 VITALS — BP 98/64 | HR 71 | Temp 98.4°F | Ht 67.0 in | Wt 247.0 lb

## 2020-07-27 DIAGNOSIS — F418 Other specified anxiety disorders: Secondary | ICD-10-CM | POA: Diagnosis not present

## 2020-07-27 MED ORDER — HYDROXYZINE HCL 50 MG PO TABS: 25.0000 mg | ORAL_TABLET | Freq: Three times a day (TID) | ORAL | 0 refills | Status: DC | PRN

## 2020-07-27 MED ORDER — SERTRALINE HCL 50 MG PO TABS: 50.0000 mg | ORAL_TABLET | Freq: Every day | ORAL | 5 refills | Status: DC

## 2020-07-27 NOTE — Progress Notes (Signed)
Subjective: Peggy Ford and anxiety PCP: [name], DO [id number] Peggy Ford is a 25 y.o. female presenting to clinic today for:  1. Depression/ anxiety Patient was last seen in 2020 for depressive disorder.  Medical history was significant for worsening of anxiety with buspirone and previous failure of Lexapro due to increased anxiety.  She was started on bupropion 150 twice daily.  She notes that she really did not have a substantial improvement in her depressive symptoms with this as she had previously.  She notes that she has some stressors that are interpersonal with a friend.  She finds this to be exacerbating some of her symptoms.  Work life and home life are pretty good.  She is change positions at her job and seems to be enjoying this more.   ROS: Per HPI  No Known Allergies Past Medical History:  Diagnosis Date  . Abdominal pain     Current Outpatient Medications:  .  buPROPion (WELLBUTRIN SR) 150 MG 12 hr tablet, Take 1 tablet (150 mg total) by mouth 2 (two) times daily., Disp: 180 tablet, Rfl: 1 Social History   Socioeconomic History  . Marital status: Single    Spouse name: Not on file  . Number of children: Not on file  . Years of education: Not on file  . Highest education level: Not on file  Occupational History  . Not on file  Tobacco Use  . Smoking status: Never Smoker  . Smokeless tobacco: Never Used  Vaping Use  . Vaping Use: Never used  Substance and Sexual Activity  . Alcohol use: No    Alcohol/week: 0.0 standard drinks  . Drug use: No  . Sexual activity: Never    Birth control/protection: None  Other Topics Concern  . Not on file  Social History Narrative  . Not on file   Social Determinants of Health   Financial Resource Strain: Not on file  Food Insecurity: Not on file  Transportation Needs: Not on file  Physical Activity: Not on file  Stress: Not on file  Social Connections: Not on file  Intimate Partner Violence: Not on  file   Family History  Problem Relation Age of Onset  . Heart disease Other        great gm  . Diabetes Other        great gf  . Colon cancer Other        great gm  . Irritable bowel syndrome Maternal Grandmother   . Diverticulosis Paternal Grandfather     Objective: Office vital signs reviewed. BP 98/64   Pulse 71   Temp 98.4 F (36.9 C) (Temporal)   Ht 5\' 7"  (1.702 m)   Wt 247 lb (112 kg)   SpO2 96%   BMI 38.69 kg/m   Physical Examination:  General: Awake, alert, well nourished, No acute distress HEENT: Normal; sclera white.  No thyromegaly.  No exophthalmos Cardio: regular rate and rhythm, S1S2 heard, no murmurs appreciated Pulm: clear to auscultation bilaterally, no wheezes, rhonchi or rales; normal work of breathing on room air GI: No hepatomegaly.  Abdomen soft Psych: Mood is slightly depressed.  Patient is pleasant and interactive.  Eye contact fair.  Does not appear to be responding to internal stimuli  Depression screen [facility] 2/9 07/27/2020 11/18/2018 10/31/2018  Decreased Interest 1 0 2  Down, Depressed, Hopeless 1 1 2   PHQ - 2 Score 2 1 4   Altered sleeping 1 0 1  Tired, decreased energy 1 0 3  Change in appetite 2 0 1  Feeling bad or failure about yourself  1 1 1   Trouble concentrating 0 1 3  Moving slowly or fidgety/restless 0 0 1  Suicidal thoughts 0 0 0  PHQ-9 Score 7 3 14   Difficult doing work/chores Somewhat difficult - -  Some recent data might be hidden   GAD 7 : Generalized Anxiety Score 07/27/2020 10/31/2018 04/27/2016  Nervous, Anxious, on Edge 3 3 3   Control/stop worrying 3 2 3   Worry too much - different things 3 2 3   Trouble relaxing 2 1 2   Restless 1 0 3  Easily annoyed or irritable 2 2 3   Afraid - awful might happen 1 1 1   Total GAD 7 Score 15 11 18   Anxiety Difficulty Somewhat difficult - Somewhat difficult   Assessment/ Plan: 25 y.o. female   Mixed anxiety and depressive disorder - Plan: sertraline (ZOLOFT) 50 MG tablet, hydrOXYzine  (ATARAX/VISTARIL) 50 MG tablet  Trial of Zoloft, Atarax.  I have given her information on GeneSight testing.  She will schedule with Delight Ovens if she desires this.  I like to follow-up with her in 6 weeks for recheck of mood.  She is considering IUD removal.  We will be glad to perform this if needed but she will need to make an appropriate appointment to do so.  National suicide hotline was provided, [place] crisis hotline in [place] crisis hotline provided.    No orders of the defined types were placed in this encounter.  No orders of the defined types were placed in this encounter.    [name], DO [place] [phone]

## 2020-07-27 NOTE — Patient Instructions (Signed)
Check with your insurance about GeneSite testing.  Call for an appointment with Hendricks Limes, FNP at our office if you decide you'd like this testing performed.  You may schedule with me in 6 weeks for IUD removal and recheck of mood.  Taking the medicine as directed and not missing any doses is one of the best things you can do to treat your depression.  Here are some things to keep in mind:  1) Side effects (stomach upset, some increased anxiety) may happen before you notice a benefit.  These side effects typically go away over time. 2) Changes to your dose of medicine or a change in medication all together is sometimes necessary 3) Most people need to be on medication at least 12 months 4) Many people will notice an improvement within two weeks but the full effect of the medication can take up to 4-6 weeks 5) Stopping the medication when you start feeling better often results in a return of symptoms 6) Never discontinue your medication without contacting a health care professional first.  Some medications require gradual discontinuation/ taper and can make you sick if you stop them abruptly.  If your symptoms worsen or you have thoughts of suicide/homicide, PLEASE SEEK IMMEDIATE MEDICAL ATTENTION.  You may always call:  National Suicide Hotline: 470-726-9575 [place]: 205-229-3304 Crisis Recovery in [place]: 778-608-1991   These are available 24 hours a day, 7 days a week.

## 2020-08-24 ENCOUNTER — Encounter: Payer: Self-pay | Admitting: Family Medicine

## 2020-09-07 ENCOUNTER — Ambulatory Visit (INDEPENDENT_AMBULATORY_CARE_PROVIDER_SITE_OTHER): Payer: 59 | Admitting: Family Medicine

## 2020-09-07 ENCOUNTER — Other Ambulatory Visit: Payer: Self-pay

## 2020-09-07 ENCOUNTER — Encounter: Payer: Self-pay | Admitting: Family Medicine

## 2020-09-07 VITALS — BP 110/71 | HR 70 | Temp 98.2°F | Ht 67.0 in | Wt 244.0 lb

## 2020-09-07 DIAGNOSIS — F329 Major depressive disorder, single episode, unspecified: Secondary | ICD-10-CM

## 2020-09-07 DIAGNOSIS — F418 Other specified anxiety disorders: Secondary | ICD-10-CM | POA: Diagnosis not present

## 2020-09-07 NOTE — Progress Notes (Addendum)
   Assessment & Plan:  1. Mixed anxiety and depressive disorder GeneSight testing completed today.    Follow up plan: Return as directed by PCP.  Hendricks Limes, MSN, APRN, FNP-C [facility]  Subjective:   Patient ID: Peggy Ford, female    DOB: [birth year], 25 y.o.   MRN: [id number]  HPI: Peggy Ford is a 25 y.o. female presenting on 09/07/2020 for gene sight  Patient is here for GeneSight testing due to depression and anxiety. Patient reports failing therapy with Celexa and BuSpar.    ROS: Negative unless specifically indicated above in HPI.   Relevant past medical history reviewed and updated as indicated.   Allergies and medications reviewed and updated.   Current Outpatient Medications:  .  hydrOXYzine (ATARAX/VISTARIL) 50 MG tablet, Take 0.5-1 tablets (25-50 mg total) by mouth every 8 (eight) hours as needed for anxiety., Disp: 30 tablet, Rfl: 0 .  sertraline (ZOLOFT) 50 MG tablet, Take 1 tablet (50 mg total) by mouth daily., Disp: 30 tablet, Rfl: 5  No Known Allergies  Objective:   BP 110/71   Pulse 70   Temp 98.2 F (36.8 C) (Temporal)   Ht 5\' 7"  (1.702 m)   Wt 244 lb (110.7 kg)   SpO2 98%   BMI 38.22 kg/m    Physical Exam Vitals reviewed.  Constitutional:      General: She is not in acute distress.    Appearance: Normal appearance. She is not ill-appearing, toxic-appearing or diaphoretic.  HENT:     Head: Normocephalic and atraumatic.  Eyes:     General: No scleral icterus.       Right eye: No discharge.        Left eye: No discharge.     Conjunctiva/sclera: Conjunctivae normal.  Cardiovascular:     Rate and Rhythm: Normal rate.  Pulmonary:     Effort: Pulmonary effort is normal. No respiratory distress.  Musculoskeletal:        General: Normal range of motion.     Cervical back: Normal range of motion.  Skin:    General: Skin is warm and dry.     Capillary Refill: Capillary refill takes less than 2 seconds.   Neurological:     General: No focal deficit present.     Mental Status: She is alert and oriented to person, place, and time. Mental status is at baseline.  Psychiatric:        Mood and Affect: Mood normal.        Behavior: Behavior normal.        Thought Content: Thought content normal.        Judgment: Judgment normal.

## 2020-09-12 ENCOUNTER — Ambulatory Visit: Payer: 59 | Admitting: Family Medicine

## 2020-09-16 ENCOUNTER — Encounter: Payer: Self-pay | Admitting: Family Medicine

## 2020-09-18 ENCOUNTER — Encounter: Payer: Self-pay | Admitting: Family Medicine

## 2020-10-28 ENCOUNTER — Ambulatory Visit: Payer: 59 | Admitting: Family Medicine

## 2020-11-01 ENCOUNTER — Other Ambulatory Visit: Payer: Self-pay

## 2020-11-01 ENCOUNTER — Encounter: Payer: Self-pay | Admitting: Family Medicine

## 2020-11-01 ENCOUNTER — Ambulatory Visit (INDEPENDENT_AMBULATORY_CARE_PROVIDER_SITE_OTHER): Payer: 59 | Admitting: Family Medicine

## 2020-11-01 VITALS — BP 101/65 | HR 76 | Temp 97.6°F | Ht 67.0 in | Wt 250.2 lb

## 2020-11-01 DIAGNOSIS — F418 Other specified anxiety disorders: Secondary | ICD-10-CM

## 2020-11-01 DIAGNOSIS — Z113 Encounter for screening for infections with a predominantly sexual mode of transmission: Secondary | ICD-10-CM | POA: Diagnosis not present

## 2020-11-01 DIAGNOSIS — M545 Low back pain, unspecified: Secondary | ICD-10-CM | POA: Diagnosis not present

## 2020-11-01 DIAGNOSIS — E669 Obesity, unspecified: Secondary | ICD-10-CM | POA: Diagnosis not present

## 2020-11-01 DIAGNOSIS — G8929 Other chronic pain: Secondary | ICD-10-CM

## 2020-11-01 LAB — BAYER DCA HB A1C WAIVED: HB A1C (BAYER DCA - WAIVED): 5.1 % (ref <7.0)

## 2020-11-01 MED ORDER — VENLAFAXINE HCL ER 75 MG PO CP24: 75.0000 mg | ORAL_CAPSULE | Freq: Every day | ORAL | 3 refills | Status: DC

## 2020-11-01 NOTE — Progress Notes (Signed)
Subjective: CC: Follow-up anxiety depression PCP: [name], DO Peggy Ford is a 25 y.o. female presenting to clinic today for:  1.  GAD/ depression She felt that anxiety moved into depression and Zoloft x1 month seemed to worsen symptoms.  She would like to proceed with one of the medications that were outlined on the recent GeneSight testing that was performed.  To summarize BuSpar is on the effective list but she has had adverse side effects from this where she felt that symptoms were worse on that medicine.  She does note that she is in a new relationship and would like to go ahead and proceed with physical and Pap smear soon.  2.  Low back pain Patient reports that she sometimes has some low back pain.  This seems to be related to the repetitive motion she performs at work.  Would like some physical therapy for this.  Denies any numbness, tingling or falls.  No weakness in lower extremities.  ROS: Per HPI  No Known Allergies Past Medical History:  Diagnosis Date   Abdominal pain     Current Outpatient Medications:    hydrOXYzine (ATARAX/VISTARIL) 50 MG tablet, Take 0.5-1 tablets (25-50 mg total) by mouth every 8 (eight) hours as needed for anxiety., Disp: 30 tablet, Rfl: 0 Social History   Socioeconomic History   Marital status: Single    Spouse name: Not on file   Number of children: Not on file   Years of education: Not on file   Highest education level: Not on file  Occupational History   Not on file  Tobacco Use   Smoking status: Never   Smokeless tobacco: Never  Vaping Use   Vaping Use: Never used  Substance and Sexual Activity   Alcohol use: No    Alcohol/week: 0.0 standard drinks   Drug use: No   Sexual activity: Never    Birth control/protection: None  Other Topics Concern   Not on file  Social History Narrative   Not on file   Social Determinants of Health   Financial Resource Strain: Not on file  Food Insecurity: Not on file   Transportation Needs: Not on file  Physical Activity: Not on file  Stress: Not on file  Social Connections: Not on file  Intimate Partner Violence: Not on file   Family History  Problem Relation Age of Onset   Heart disease Other        great gm   Diabetes Other        great gf   Colon cancer Other        great gm   Irritable bowel syndrome Maternal Grandmother    Diverticulosis Paternal Grandfather     Objective: Office vital signs reviewed. BP 101/65   Pulse 76   Temp 97.6 F (36.4 C)   Ht 5' 7" (1.702 m)   Wt 250 lb 3.2 oz (113.5 kg)   SpO2 95%   BMI 39.19 kg/m   Physical Examination:  General: Awake, alert, well nourished, No acute distress MSK: Ambulating independently.  Gait appears normal.  She has normal tone Psych: Mood is stable.  Speech is normal.  Affect is appropriate.  She has good eye contact.  Does not appear to be responding to internal stimuli Depression screen [facility] 2/9 11/01/2020 07/27/2020 11/18/2018  Decreased Interest 2 1 0  Down, Depressed, Hopeless _0 PHQ - 2 Score _1 Altered sleeping 2 1 0  Tired, decreased  energy 3 1 0  Change in appetite 1 2 0  Feeling bad or failure about yourself  _0 Trouble concentrating 0 0 1  Moving slowly or fidgety/restless 1 0 0  Suicidal thoughts 0 0 0  PHQ-9 Score _1 Difficult doing work/chores Somewhat difficult Somewhat difficult -  Some recent data might be hidden   GAD 7 : Generalized Anxiety Score 11/01/2020 07/27/2020 10/31/2018 04/27/2016  Nervous, Anxious, on Edge _2 Control/stop worrying _3 Worry too much - different things _4 Trouble relaxing _5 Restless 1 1 0 3  Easily annoyed or irritable _6 Afraid - awful might happen _7 Total GAD 7 Score _8 Anxiety Difficulty Somewhat difficult Somewhat difficult - Somewhat difficult   Assessment/ Plan: 25 y.o. female   Mixed anxiety and depressive disorder - Plan: venlafaxine XR (EFFEXOR XR) 75 MG  24 hr capsule  Chronic bilateral low back pain without sciatica  Screen for STD (sexually transmitted disease) - Plan: RPR, Hepatitis C antibody, HIV antibody (with reflex), HSV I/II IgM Rflx I/II Type Sp  Obesity (BMI 35.0-39.9 without comorbidity) - Plan: Bayer DCA Hb A1c Waived, Lipid Panel, CMP14+EGFR, TSH  Start Effexor.  We reviewed her GeneSight testing results.  Would like to reconvene in the next 6 to 8 weeks at which point we will perform her physical with Pap smear.  Fasting labs were obtained today however.  I have given her home physical therapy exercises for low back pain.  Caution with NSAIDs whilst on SNRI  No orders of the defined types were placed in this encounter.  No orders of the defined types were placed in this encounter.    [name], DO [facility] [phone]

## 2020-11-01 NOTE — Patient Instructions (Signed)
Taking the medicine as directed and not missing any doses is one of the best things you can do to treat your depression.  Here are some things to keep in mind:  Side effects (stomach upset, some increased anxiety) may happen before you notice a benefit.  These side effects typically go away over time. Changes to your dose of medicine or a change in medication all together is sometimes necessary Most people need to be on medication at least 12 months Many people will notice an improvement within two weeks but the full effect of the medication can take up to 4-6 weeks Stopping the medication when you start feeling better often results in a return of symptoms Never discontinue your medication without contacting a health care professional first.  Some medications require gradual discontinuation/ taper and can make you sick if you stop them abruptly.  If your symptoms worsen or you have thoughts of suicide/homicide, PLEASE SEEK IMMEDIATE MEDICAL ATTENTION.  You may always call:  National Suicide Hotline: 7542058722 [place]: 236 410 9797 Crisis Recovery in [place]: 2548116572   These are available 24 hours a day, 7 days a week.

## 2020-11-02 LAB — CMP14+EGFR
ALT: 17 IU/L (ref 0–32)
AST: 16 IU/L (ref 0–40)
Albumin/Globulin Ratio: 1.7 (ref 1.2–2.2)
Albumin: 4.3 g/dL (ref 3.9–5.0)
Alkaline Phosphatase: 64 IU/L (ref 44–121)
BUN/Creatinine Ratio: 14 (ref 9–23)
BUN: 11 mg/dL (ref 6–20)
Bilirubin Total: 0.2 mg/dL (ref 0.0–1.2)
CO2: 23 mmol/L (ref 20–29)
Calcium: 9.2 mg/dL (ref 8.7–10.2)
Chloride: 104 mmol/L (ref 96–106)
Creatinine, Ser: 0.77 mg/dL (ref 0.57–1.00)
Globulin, Total: 2.5 g/dL (ref 1.5–4.5)
Glucose: 84 mg/dL (ref 65–99)
Potassium: 4.8 mmol/L (ref 3.5–5.2)
Sodium: 142 mmol/L (ref 134–144)
Total Protein: 6.8 g/dL (ref 6.0–8.5)
eGFR: 109 mL/min/{1.73_m2} (ref >59)

## 2020-11-02 LAB — LIPID PANEL
Chol/HDL Ratio: 3.6 ratio (ref 0.0–4.4)
Cholesterol, Total: 162 mg/dL (ref 100–199)
HDL: 45 mg/dL (ref >39)
LDL Chol Calc (NIH): 103 mg/dL — ABNORMAL HIGH (ref 0–99)
Triglycerides: 71 mg/dL (ref 0–149)
VLDL Cholesterol Cal: 14 mg/dL (ref 5–40)

## 2020-11-02 LAB — HIV ANTIBODY (ROUTINE TESTING W REFLEX): HIV Screen 4th Generation wRfx: NONREACTIVE

## 2020-11-02 LAB — HSV I/II IGM RFLX I/II TYPE SP: HSVI/II Comb IgM: <0.91 Ratio (ref 0.00–0.90)

## 2020-11-02 LAB — HEPATITIS C ANTIBODY: Hep C Virus Ab: <0.1 s/co ratio (ref 0.0–0.9)

## 2020-11-02 LAB — RPR: RPR Ser Ql: NONREACTIVE

## 2020-11-02 LAB — TSH: TSH: 2.11 u[IU]/mL (ref 0.450–4.500)

## 2020-12-28 ENCOUNTER — Ambulatory Visit (INDEPENDENT_AMBULATORY_CARE_PROVIDER_SITE_OTHER): Payer: 59 | Admitting: Family Medicine

## 2020-12-28 ENCOUNTER — Other Ambulatory Visit: Payer: Self-pay

## 2020-12-28 ENCOUNTER — Other Ambulatory Visit (HOSPITAL_COMMUNITY)
Admission: RE | Admit: 2020-12-28 | Discharge: 2020-12-28 | Disposition: A | Discharge status: Home / Self Care | Payer: Self-pay | Source: Ambulatory Visit | Attending: Family Medicine | Admitting: Family Medicine

## 2020-12-28 ENCOUNTER — Encounter: Payer: Self-pay | Admitting: Family Medicine

## 2020-12-28 VITALS — BP 108/70 | HR 73 | Temp 97.5°F | Ht 67.0 in | Wt 249.6 lb

## 2020-12-28 DIAGNOSIS — Z0001 Encounter for general adult medical examination with abnormal findings: Secondary | ICD-10-CM

## 2020-12-28 DIAGNOSIS — F418 Other specified anxiety disorders: Secondary | ICD-10-CM

## 2020-12-28 DIAGNOSIS — Z124 Encounter for screening for malignant neoplasm of cervix: Secondary | ICD-10-CM | POA: Insufficient documentation

## 2020-12-28 DIAGNOSIS — Z0000 Encounter for general adult medical examination without abnormal findings: Secondary | ICD-10-CM

## 2020-12-28 MED ORDER — VENLAFAXINE HCL ER 150 MG PO CP24: 150.0000 mg | ORAL_CAPSULE | Freq: Every day | ORAL | 3 refills | Status: DC

## 2020-12-28 MED ORDER — HYDROXYZINE HCL 50 MG PO TABS: 25.0000 mg | ORAL_TABLET | Freq: Three times a day (TID) | ORAL | 3 refills | Status: DC | PRN

## 2020-12-28 NOTE — Patient Instructions (Addendum)
Call GI to set up appointment  I think your dry mouth is exacerbated by Hydroxyzine.  Drink water.  Use Biotene mouth rinses to help  Effexor increased to 141m daily.  You had labs performed today.  You will be contacted with the results of the labs once they are available, usually in the next 3 business days for routine lab work.  If you have an active my chart account, they will be released to your MyChart.  If you prefer to have these labs released to you via telephone, please let uKoreaknow.  If you had a pap smear or biopsy performed, expect to be contacted in about 7-10 days.  Preventive Care 27989Years Old, Female Preventive care refers to lifestyle choices and visits with your health care provider that can promote health and wellness. This includes: A yearly physical exam. This is also called an annual wellness visit. Regular dental and eye exams. Immunizations. Screening for certain conditions. Healthy lifestyle choices, such as: Eating a healthy diet. Getting regular exercise. Not using drugs or products that contain nicotine and tobacco. Limiting alcohol use. What can I expect for my preventive care visit? Physical exam Your health care provider may check your: Height and weight. These may be used to calculate your BMI (body mass index). BMI is a measurement that tells if you are at a healthy weight. Heart rate and blood pressure. Body temperature. Skin for abnormal spots. Counseling Your health care provider may ask you questions about your: Past medical problems. Family's medical history. Alcohol, tobacco, and drug use. Emotional well-being. Home life and relationship well-being. Sexual activity. Diet, exercise, and sleep habits. Work and work [employer] Access to firearms. Method of birth control. Menstrual cycle. Pregnancy history. What immunizations do I need?  Vaccines are usually given at various ages, according to a schedule. Your health care provider  will recommend vaccines for you based on your age, medicalhistory, and lifestyle or other factors, such as travel or where you work. What tests do I need?  Blood tests Lipid and cholesterol levels. These may be checked every 5 years starting at age 27 Hepatitis C test. Hepatitis B test. Screening Diabetes screening. This is done by checking your blood sugar (glucose) after you have not eaten for a while (fasting). STD (sexually transmitted disease) testing, if you are at risk. BRCA-related cancer screening. This may be done if you have a family history of breast, ovarian, tubal, or peritoneal cancers. Pelvic exam and Pap test. This may be done every 3 years starting at age 27 Starting at age 27 this may be done every 5 years if you have a Pap test in combination with an HPV test. Talk with your health care provider about your test results, treatment options,and if necessary, the need for more tests. Follow these instructions at home: Eating and drinking  Eat a healthy diet that includes fresh fruits and vegetables, whole grains, lean protein, and low-fat dairy products. Take vitamin and mineral supplements as recommended by your health care provider. Do not drink alcohol if: Your health care provider tells you not to drink. You are pregnant, may be pregnant, or are planning to become pregnant. If you drink alcohol: Limit how much you have to 0-1 drink a day. Be aware of how much alcohol is in your drink. In the [place] one drink equals one 12 oz bottle of beer (355 mL), one 5 oz glass of wine (148 mL), or one 1 oz glass of hard liquor (44 mL).  Lifestyle Take daily care of your teeth and gums. Brush your teeth every morning and night with fluoride toothpaste. Floss one time each day. Stay active. Exercise for at least 30 minutes 5 or more days each week. Do not use any products that contain nicotine or tobacco, such as cigarettes, e-cigarettes, and chewing tobacco. If you need help  quitting, ask your health care provider. Do not use drugs. If you are sexually active, practice safe sex. Use a condom or other form of protection to prevent STIs (sexually transmitted infections). If you do not wish to become pregnant, use a form of birth control. If you plan to become pregnant, see your health care provider for a prepregnancy visit. Find healthy ways to cope with stress, such as: Meditation, yoga, or listening to music. Journaling. Talking to a trusted person. Spending time with friends and family. Safety Always wear your seat belt while driving or riding in a vehicle. Do not drive: If you have been drinking alcohol. Do not ride with someone who has been drinking. When you are tired or distracted. While texting. Wear a helmet and other protective equipment during sports activities. If you have firearms in your house, make sure you follow all gun safety procedures. Seek help if you have been physically or sexually abused. What's next? Go to your health care provider once a year for an annual wellness visit. Ask your health care provider how often you should have your eyes and teeth checked. Stay up to date on all vaccines. This information is not intended to replace advice given to you by your health care provider. Make sure you discuss any questions you have with your healthcare provider. Document Revised: 12/20/2019 Document Reviewed: 01/02/2018 Elsevier Patient Education  2022 [employer].

## 2020-12-28 NOTE — Progress Notes (Signed)
Peggy Ford is a 25 y.o. female presents to office today for annual physical exam examination.    Concerns today include: 1.  Depression anxiety She reports that symptoms are not well controlled.  She continues to frequently worry.  She reports low motivation and energy.  This is despite compliance with Effexor 75 mg daily.  She also uses Atarax as needed panic.  She admits to some dry mouth which can sometimes make swallowing difficult.  She voices being unhappy in her current relationship but feels guilty about considering leaving because her boyfriend is struggling with mental health disorder as well and recently lost his father last year.  Marital status: has boyfriend, Substance use: none Diet: fair, Exercise: no structured Last dental exam: UTD Last colonoscopy: needs, Dr Ardis Hughs Last mammogram: n/a Last pap smear: needs Refills needed today: as above Immunizations needed: Immunization History  Administered Date(s) Administered   DTaP 06/26/1994, 08/07/1994, 10/29/1994, 05/16/1999   Hepatitis B 11/04/93, 05/15/1994, 04/18/1995   HiB (PRP-OMP) 06/26/1994, 08/07/1994, 10/29/1994   IPV 06/26/1994, 08/07/1994, 10/29/1994, 05/16/1999   MMR 04/18/1995, 05/16/1999   PPD Test 09/01/2012   Tdap 09/04/2010     Past Medical History:  Diagnosis Date   Abdominal pain    Social History   Socioeconomic History   Marital status: Single    Spouse name: Not on file   Number of children: Not on file   Years of education: Not on file   Highest education level: Not on file  Occupational History   Not on file  Tobacco Use   Smoking status: Never   Smokeless tobacco: Never  Vaping Use   Vaping Use: Never used  Substance and Sexual Activity   Alcohol use: No    Alcohol/week: 0.0 standard drinks   Drug use: No   Sexual activity: Never    Birth control/protection: None  Other Topics Concern   Not on file  Social History Narrative   Not on file   Social Determinants of  Health   Financial Resource Strain: Not on file  Food Insecurity: Not on file  Transportation Needs: Not on file  Physical Activity: Not on file  Stress: Not on file  Social Connections: Not on file  Intimate Partner Violence: Not on file   Past Surgical History:  Procedure Laterality Date   TOENAIL EXCISION  2010   TONSILLECTOMY     Family History  Problem Relation Age of Onset   Heart disease Other        great gm   Diabetes Other        great gf   Colon cancer Other        great gm   Irritable bowel syndrome Maternal Grandmother    Diverticulosis Paternal Grandfather     Current Outpatient Medications:    hydrOXYzine (ATARAX/VISTARIL) 50 MG tablet, Take 0.5-1 tablets (25-50 mg total) by mouth every 8 (eight) hours as needed for anxiety., Disp: 30 tablet, Rfl: 0   venlafaxine XR (EFFEXOR XR) 75 MG 24 hr capsule, Take 1 capsule (75 mg total) by mouth daily with breakfast., Disp: 90 capsule, Rfl: 3  No Known Allergies   ROS: Review of Systems Pertinent items noted in HPI and remainder of comprehensive ROS otherwise negative.    Physical exam BP 108/70   Pulse 73   Temp (!) 97.5 F (36.4 C)   Ht _0  (1.702 m)   Wt 249 lb 9.6 oz (113.2 kg)   SpO2 96%   BMI  39.09 kg/m  General appearance: alert, cooperative, and appears stated age Head: Normocephalic, without obvious abnormality, atraumatic Eyes: negative findings: lids and lashes normal, conjunctivae and sclerae normal, corneas clear, and pupils equal, round, reactive to light and accomodation Ears: normal TM's and external ear canals both ears Nose: Nares normal. Septum midline. Mucosa normal. No drainage or sinus tenderness. Throat: lips, mucosa, and tongue normal; teeth and gums normal Neck: no adenopathy, supple, symmetrical, trachea midline, and thyroid not enlarged, symmetric, no tenderness/mass/nodules Back: symmetric, no curvature. ROM normal. No CVA tenderness. Lungs: clear to auscultation  bilaterally Heart: regular rate and rhythm, S1, S2 normal, no murmur, click, rub or gallop Abdomen:  obese, nondistended Pelvic: external genitalia normal, no adnexal masses or tenderness, no cervical motion tenderness, rectovaginal septum normal, uterus normal size, shape, and consistency, and some mild yellow discharge from the cervix.  IUD strings visualized.  She has some mild hyperemia noted at the 6 o'clock position of the cervix.  Minimal bleeding with Pap smear Extremities: extremities normal, atraumatic, no cyanosis or edema Pulses: 2+ and symmetric Skin: Skin color, texture, turgor normal. No rashes or lesions Lymph nodes: Cervical, supraclavicular, and axillary nodes normal. Neurologic: Alert and oriented X 3, normal strength and tone. Normal symmetric reflexes. Normal coordination and gait Psych: Mood depressed.  Patient is pleasant and interactive.  Eye contact fair  Depression screen [facility] 2/9 12/28/2020 11/01/2020 07/27/2020  Decreased Interest _0 Down, Depressed, Hopeless _1 PHQ - 2 Score _2 Altered sleeping _3 Tired, decreased energy _4 Change in appetite _5 Feeling bad or failure about yourself  _6 Trouble concentrating 2 0 0  Moving slowly or fidgety/restless 0 1 0  Suicidal thoughts 0 0 0  PHQ-9 Score _7 Difficult doing work/chores Somewhat difficult Somewhat difficult Somewhat difficult  Some recent data might be hidden   GAD 7 : Generalized Anxiety Score 12/28/2020 11/01/2020 07/27/2020 10/31/2018  Nervous, Anxious, on Edge _8 Control/stop worrying _9 Worry too much - different things _10 Trouble relaxing _11 Restless _12 0  Easily annoyed or irritable _13 Afraid - awful might happen _14 Total GAD 7 Score _15 Anxiety Difficulty Somewhat difficult Somewhat difficult Somewhat difficult -    Assessment/ Plan: Peggy Ford here for annual physical exam.   Annual physical exam  Screening  for malignant neoplasm of cervix - Plan: Cytology - PAP  Mixed anxiety and depressive disorder - Plan: venlafaxine XR (EFFEXOR XR) 150 MG 24 hr capsule, hydrOXYzine (ATARAX/VISTARIL) 50 MG tablet Appears to be overdue for colonoscopy.  Going to [place] her GI doctor as Juluis Rainier and to see if this is still recommended  Pap smear performed.  STI cotesting sent.  IUD strings are well visualized on today's exam  Anxiety and depressive disorder not controlled.  Advance Effexor to 150 mg daily.  Hydroxyzine renewed.  Reinforced adequate hydration, may use Biotene if needed for dry mouth.  Discussed self-care, establishing boundaries to promote mental health  Handout on healthy lifestyle choices, including diet (rich in fruits, vegetables and lean meats and low in salt and simple carbohydrates) and exercise (at least 30 minutes of moderate physical activity daily).  Eriel Dunckel M. Lajuana Ripple, DO

## 2020-12-29 ENCOUNTER — Telehealth: Payer: Self-pay

## 2020-12-29 NOTE — Telephone Encounter (Signed)
The recall has been updated

## 2020-12-29 NOTE — Telephone Encounter (Signed)
-----   Message from [name], MD sent at 12/29/2020  9:29 AM EDT ----- Surveillance guidelines have changed and so she is not "due" until 7 years from her 2017 examination.  Peggy Ford, Can you make sure she is in recall system for that?  Thanks   ----- Message ----- From: [name], DO Sent: 12/28/2020   4:00 PM EDT To: [name], MD  Still wanting her to do colonoscopy?  She wasn't sure but it is listed in HM as overdue

## 2020-12-30 LAB — CYTOLOGY - PAP
Chlamydia: NEGATIVE
Comment: NEGATIVE
Comment: NEGATIVE
Comment: NORMAL
Diagnosis: NEGATIVE
Neisseria Gonorrhea: NEGATIVE
Trichomonas: NEGATIVE

## 2021-05-11 ENCOUNTER — Ambulatory Visit (INDEPENDENT_AMBULATORY_CARE_PROVIDER_SITE_OTHER): Payer: 59 | Admitting: Family Medicine

## 2021-05-11 ENCOUNTER — Encounter: Payer: Self-pay | Admitting: Family Medicine

## 2021-05-11 VITALS — BP 117/72 | HR 72 | Temp 97.9°F | Ht 67.0 in | Wt 263.4 lb

## 2021-05-11 DIAGNOSIS — F418 Other specified anxiety disorders: Secondary | ICD-10-CM

## 2021-05-11 MED ORDER — FLUOXETINE HCL 10 MG PO CAPS: 10.0000 mg | ORAL_CAPSULE | Freq: Every day | ORAL | 1 refills | Status: DC

## 2021-05-11 MED ORDER — VENLAFAXINE HCL ER 37.5 MG PO CP24: 37.5000 mg | ORAL_CAPSULE | Freq: Every day | ORAL | 0 refills | Status: DC

## 2021-05-11 NOTE — Progress Notes (Signed)
Subjective: CC: Anxiety and depression PCP: Peggy Norlander, DO MPN:TIRWE Peggy Ford is a 26 y.o. female presenting to clinic today for:  1.  Anxiety and depression Patient still has ongoing anxiety and depression.  At the higher dose of Effexor 150 mg, she still did not feel really any different and in fact felt maybe it was exacerbating her symptoms little.  Although, she does admit that the holidays are never really a good time for her.  She has subsequently been on the 75 mg capsules for the last month.  She has been trying to self wean so that we can transition to an alternative.  She did forget to take the medicine once and had some pretty significant withdrawal symptoms that she describes as dizziness.  She would like something that perhaps has a longer half-life.  She is no longer in a relationship.  She reports increased stress at work.  She has come to the point where she feels like she is amenable to seeing a psychiatrist and would be willing to have that referral placed today as well.  No SI HI.    ROS: Per HPI  No Known Allergies Past Medical History:  Diagnosis Date   Abdominal pain     Current Outpatient Medications:    hydrOXYzine (ATARAX/VISTARIL) 50 MG tablet, Take 0.5-1 tablets (25-50 mg total) by mouth every 8 (eight) hours as needed for anxiety., Disp: 30 tablet, Rfl: 3   venlafaxine XR (EFFEXOR XR) 150 MG 24 hr capsule, Take 1 capsule (150 mg total) by mouth daily with breakfast., Disp: 90 capsule, Rfl: 3 Social History   Socioeconomic History   Marital status: Single    Spouse name: Not on file   Number of children: Not on file   Years of education: Not on file   Highest education level: Not on file  Occupational History   Not on file  Tobacco Use   Smoking status: Never   Smokeless tobacco: Never  Vaping Use   Vaping Use: Never used  Substance and Sexual Activity   Alcohol use: No    Alcohol/week: 0.0 standard drinks   Drug use: No   Sexual  activity: Never    Birth control/protection: None  Other Topics Concern   Not on file  Social History Narrative   Not on file   Social Determinants of Health   Financial Resource Strain: Not on file  Food Insecurity: Not on file  Transportation Needs: Not on file  Physical Activity: Not on file  Stress: Not on file  Social Connections: Not on file  Intimate Partner Violence: Not on file   Family History  Problem Relation Age of Onset   Heart disease Other        great gm   Diabetes Other        great gf   Colon cancer Other        great gm   Irritable bowel syndrome Maternal Grandmother    Diverticulosis Paternal Grandfather     Objective: Office vital signs reviewed. BP 117/72    Pulse 72    Temp 97.9 F (36.6 C)    Ht 5\' 7"  (1.702 m)    Wt 263 lb 6.4 oz (119.5 kg)    SpO2 99%    BMI 41.25 kg/m   Physical Examination:  General: Awake, alert, No acute distress Psych: Mood depressed.  Depression screen [facility] 2/9 05/11/2021 12/28/2020 11/01/2020  Decreased Interest 3 2 2   Down, Depressed, Hopeless 2  3 2  PHQ - 2 Score 5 5 4   Altered sleeping 3 2 2   Tired, decreased energy 3 3 3   Change in appetite 1 1 1   Feeling bad or failure about yourself  2 2 2   Trouble concentrating 2 2 0  Moving slowly or fidgety/restless 0 0 1  Suicidal thoughts 0 0 0  PHQ-9 Score 16 15 13   Difficult doing work/chores Very difficult Somewhat difficult Somewhat difficult  Some recent data might be hidden   GAD 7 : Generalized Anxiety Score 05/11/2021 12/28/2020 11/01/2020 07/27/2020  Nervous, Anxious, on Edge 2 3 1 3   Control/stop worrying 1 3 1 3   Worry too much - different things 2 3 1 3   Trouble relaxing 1 2 1 2   Restless 2 1 1 1   Easily annoyed or irritable 3 1 3 2   Afraid - awful might happen 1 1 1 1   Total GAD 7 Score 12 14 9 15   Anxiety Difficulty Very difficult Somewhat difficult Somewhat difficult Somewhat difficult    Assessment/ Plan: 26 y.o. female   Mixed anxiety and  depressive disorder - Plan: FLUoxetine (PROZAC) 10 MG capsule, venlafaxine XR (EFFEXOR XR) 37.5 MG 24 hr capsule, Ambulatory referral to Psychiatry  Not controlled.  I have placed referral to psychiatry.  I certainly would appreciate some assistance with medications since she has failed or had intolerance to multiple therapies.  We will trial Prozac 10 mg and cross-taper with a reduced dose of Effexor.  Recommend reduce Effexor dose for at least 1 month as this is a medication that is associated with unwanted withdrawal symptoms.  Would like to reconvene in about a month and we can advance the Prozac to 20 mg if she feels like she is tolerating it.  Referral will hopefully be accessible soon  No orders of the defined types were placed in this encounter.  No orders of the defined types were placed in this encounter.    Peggy Norlander, DO [place] [phone]

## 2021-05-29 ENCOUNTER — Ambulatory Visit: Payer: 59 | Admitting: Family Medicine

## 2021-06-21 ENCOUNTER — Ambulatory Visit (INDEPENDENT_AMBULATORY_CARE_PROVIDER_SITE_OTHER): Payer: 59 | Admitting: Family Medicine

## 2021-06-21 ENCOUNTER — Encounter: Payer: Self-pay | Admitting: Family Medicine

## 2021-06-21 VITALS — BP 117/76 | HR 76 | Temp 97.8°F | Ht 67.0 in | Wt 261.2 lb

## 2021-06-21 DIAGNOSIS — F418 Other specified anxiety disorders: Secondary | ICD-10-CM | POA: Diagnosis not present

## 2021-06-21 MED ORDER — FLUOXETINE HCL 20 MG PO CAPS: 20.0000 mg | ORAL_CAPSULE | Freq: Every day | ORAL | 0 refills | Status: DC

## 2021-06-21 NOTE — Progress Notes (Signed)
Subjective: CC: Follow-up anxiety depression PCP: [name], DO NTZ:GYFVC Peggy Ford is a 26 y.o. female presenting to clinic today for:  1.  Anxiety and depression 6 weeks ago, patient was started on Prozac 10 mg daily.  She has been cross tapered off of the Effexor 37.5 mg.  She has failed BuSpar, Lexapro, Zoloft and Effexor but has noticed a good deal of improvement with the Prozac 10 mg daily.  She finally feels like she is getting somewhere.  The only thing of concern that she mentions today is that she has noticed an increase in hunger over the last week.  Cannot identify any other potential triggers but it does worry her.  Overall she is very pleased with how things are going with the Prozac and wishes to advance the dose   ROS: Per HPI  No Known Allergies Past Medical History:  Diagnosis Date   Abdominal pain     Current Outpatient Medications:    FLUoxetine (PROZAC) 10 MG capsule, Take 1 capsule (10 mg total) by mouth daily., Disp: 30 capsule, Rfl: 1   hydrOXYzine (ATARAX/VISTARIL) 50 MG tablet, Take 0.5-1 tablets (25-50 mg total) by mouth every 8 (eight) hours as needed for anxiety., Disp: 30 tablet, Rfl: 3 Social History   Socioeconomic History   Marital status: Single    Spouse name: Not on file   Number of children: Not on file   Years of education: Not on file   Highest education level: Not on file  Occupational History   Not on file  Tobacco Use   Smoking status: Never   Smokeless tobacco: Never  Vaping Use   Vaping Use: Never used  Substance and Sexual Activity   Alcohol use: No    Alcohol/week: 0.0 standard drinks   Drug use: No   Sexual activity: Never    Birth control/protection: None  Other Topics Concern   Not on file  Social History Narrative   Not on file   Social Determinants of Health   Financial Resource Strain: Not on file  Food Insecurity: Not on file  Transportation Needs: Not on file  Physical Activity: Not on file   Stress: Not on file  Social Connections: Not on file  Intimate Partner Violence: Not on file   Family History  Problem Relation Age of Onset   Heart disease Other        great gm   Diabetes Other        great gf   Colon cancer Other        great gm   Irritable bowel syndrome Maternal Grandmother    Diverticulosis Paternal Grandfather     Objective: Office vital signs reviewed. BP 117/76    Pulse 76    Temp 97.8 F (36.6 C)    Ht 5\' 7"  (1.702 m)    Wt 261 lb 3.2 oz (118.5 kg)    SpO2 99%    BMI 40.91 kg/m   Physical Examination:  General: Awake, alert, well-appearing, morbidly obese Psych: Mood stable, speech normal, affect appropriate.  Seems happier than previous visit  Depression screen [facility] 2/9 06/21/2021 05/11/2021 12/28/2020  Decreased Interest 2 3 2   Down, Depressed, Hopeless 1 2 3   PHQ - 2 Score 3 5 5   Altered sleeping 2 3 2   Tired, decreased energy 2 3 3   Change in appetite 3 1 1   Feeling bad or failure about yourself  1 2 2   Trouble concentrating 1 2 2   Moving  slowly or fidgety/restless 0 0 0  Suicidal thoughts 0 0 0  PHQ-9 Score 12 16 15   Difficult doing work/chores Somewhat difficult Very difficult Somewhat difficult  Some recent data might be hidden   GAD 7 : Generalized Anxiety Score 06/21/2021 05/11/2021 12/28/2020 11/01/2020  Nervous, Anxious, on Edge 0 2 3 1   Control/stop worrying 0 1 3 1   Worry too much - different things 0 2 3 1   Trouble relaxing 0 1 2 1   Restless 0 2 1 1   Easily annoyed or irritable 1 3 1 3   Afraid - awful might happen 1 1 1 1   Total GAD 7 Score 2 12 14 9   Anxiety Difficulty Somewhat difficult Very difficult Somewhat difficult Somewhat difficult      Assessment/ Plan: 26 y.o. female   Mixed anxiety and depressive disorder - Plan: FLUoxetine (PROZAC) 20 MG capsule  Morbid obesity ([place])  Symptomatically improving.  Advance Prozac 20mg  daily.  Would favor not taking her off of Prozac given her failure of buspirone, Lexapro, Zoloft  and Effexor.  This is finally something that is working well for her.  Rather, we could combat her weight with the below  We will continue to monitor weight closely.  Usually Prozac is weight neutral some somewhat surprise she has noticed an increase in hunger with this medication.  However, we discussed that we certainly could treat her for obesity as well if needed.  We discussed possible treatment plan including GLP.  I given her a handout to inquire as to whether or not [place] or [place] is covered by her insurance.  Alternatively, we could consider something like Vyvanse for binge eating disorder.  No orders of the defined types were placed in this encounter.  No orders of the defined types were placed in this encounter.    [name], DO [place] [phone]

## 2021-08-02 ENCOUNTER — Ambulatory Visit (INDEPENDENT_AMBULATORY_CARE_PROVIDER_SITE_OTHER): Payer: 59 | Admitting: Family Medicine

## 2021-08-02 ENCOUNTER — Encounter: Payer: Self-pay | Admitting: Family Medicine

## 2021-08-02 VITALS — BP 132/68 | HR 77 | Temp 97.8°F | Ht 67.0 in | Wt 259.8 lb

## 2021-08-02 DIAGNOSIS — F5081 Binge eating disorder: Secondary | ICD-10-CM | POA: Diagnosis not present

## 2021-08-02 DIAGNOSIS — F418 Other specified anxiety disorders: Secondary | ICD-10-CM | POA: Diagnosis not present

## 2021-08-02 MED ORDER — LISDEXAMFETAMINE DIMESYLATE 30 MG PO CAPS: 30.0000 mg | ORAL_CAPSULE | Freq: Every day | ORAL | 0 refills | Status: DC

## 2021-08-02 MED ORDER — FLUOXETINE HCL 40 MG PO CAPS: 40.0000 mg | ORAL_CAPSULE | Freq: Every day | ORAL | 3 refills | Status: DC

## 2021-08-02 NOTE — Progress Notes (Signed)
? ?Subjective: ?CC: Follow-up depression anxiety, morbid obesity ?PCP: [name], DO ?ION:Peggy Ford is a 26 y.o. female presenting to clinic today for: ? ?1.  Depression and anxiety ?Patient had some improvement in depression anxiety at her last visit but she notes that symptoms seem to be getting worse again she reports low mood, feeling fatigued and feeling hungry all the time.  She admits that some of this may be due to the fact that she feels isolated at work.  She recently reenrolled in school and is pursuing an electrical degree.  She would like to advance her Prozac. ? ?2.  Obesity, binge eating disorder ?Patient reports binge eating behaviors where she will eat past the point of fullness and feels guilty about it.  She feels hungry all the time.  She looked into the injection that we discussed last visit but it sounds like they are not covered by her insurance and/or have quite a large deductible. ? ? ?ROS: Per HPI ? ?No Known Allergies ?Past Medical History:  ?Diagnosis Date  ? Abdominal pain   ? ? ?Current Outpatient Medications:  ?  FLUoxetine (PROZAC) 20 MG capsule, Take 1 capsule (20 mg total) by mouth daily., Disp: 90 capsule, Rfl: 0 ?  hydrOXYzine (ATARAX/VISTARIL) 50 MG tablet, Take 0.5-1 tablets (25-50 mg total) by mouth every 8 (eight) hours as needed for anxiety., Disp: 30 tablet, Rfl: 3 ?Social History  ? ?Socioeconomic History  ? Marital status: Single  ?  Spouse name: Not on file  ? Number of children: Not on file  ? Years of education: Not on file  ? Highest education level: Not on file  ?Occupational History  ? Not on file  ?Tobacco Use  ? Smoking status: Never  ? Smokeless tobacco: Never  ?Vaping Use  ? Vaping Use: Never used  ?Substance and Sexual Activity  ? Alcohol use: No  ?  Alcohol/week: 0.0 standard drinks  ? Drug use: No  ? Sexual activity: Never  ?  Birth control/protection: None  ?Other Topics Concern  ? Not on file  ?Social History Narrative  ? Not on file   ? ?Social Determinants of Health  ? ?Financial Resource Strain: Not on file  ?Food Insecurity: Not on file  ?Transportation Needs: Not on file  ?Physical Activity: Not on file  ?Stress: Not on file  ?Social Connections: Not on file  ?Intimate Partner Violence: Not on file  ? ?Family History  ?Problem Relation Age of Onset  ? Heart disease Other   ?     great gm  ? Diabetes Other   ?     great gf  ? Colon cancer Other   ?     great gm  ? Irritable bowel syndrome Maternal Grandmother   ? Diverticulosis Paternal Grandfather   ? ? ?Objective: ?Office vital signs reviewed. ?BP 132/68   Pulse 77   Temp 97.8 ?F (36.6 ?C)   Ht '5\' 7"'$  (1.702 m)   Wt 259 lb 12.8 oz (117.8 kg)   SpO2 95%   BMI 40.69 kg/m?  ? ?Physical Examination:  ?General: Awake, alert, well nourished.  Morbidly obese, No acute distress ?HEENT: Sclera white.  Moist mucous membranes ?Cardio: regular rate and rhythm, S1S2 heard, no murmurs appreciated ?Pulm: clear to auscultation bilaterally, no wheezes, rhonchi or rales; normal work of breathing on room air ?Psych: Mood stable, speech normal ? ? ?  06/21/2021  ?  4:09 PM 05/11/2021  ?  4:32 PM 12/28/2020  ?  3:27 PM  ?Depression screen PHQ 2/9  ?Decreased Interest '2 3 2  '$ ?Down, Depressed, Hopeless '1 2 3  '$ ?PHQ - 2 Score '3 5 5  '$ ?Altered sleeping '2 3 2  '$ ?Tired, decreased energy '2 3 3  '$ ?Change in appetite '3 1 1  '$ ?Feeling bad or failure about yourself  '1 2 2  '$ ?Trouble concentrating '1 2 2  '$ ?Moving slowly or fidgety/restless 0 0 0  ?Suicidal thoughts 0 0 0  ?PHQ-9 Score '12 16 15  '$ ?Difficult doing work/chores Somewhat difficult Very difficult Somewhat difficult  ? ? ?  06/21/2021  ?  4:10 PM 05/11/2021  ?  4:32 PM 12/28/2020  ?  3:28 PM 11/01/2020  ?  3:43 PM  ?GAD 7 : Generalized Anxiety Score  ?Nervous, Anxious, on Edge 0 '2 3 1  '$ ?Control/stop worrying 0 '1 3 1  '$ ?Worry too much - different things 0 '2 3 1  '$ ?Trouble relaxing 0 '1 2 1  '$ ?Restless 0 '2 1 1  '$ ?Easily annoyed or irritable '1 3 1 3  '$ ?Afraid - awful might happen '1 1  1 1  '$ ?Total GAD 7 Score '2 12 14 9  '$ ?Anxiety Difficulty Somewhat difficult Very difficult Somewhat difficult Somewhat difficult  ? ? ? ? ?Assessment/ Plan: ?26 y.o. female  ? ?Binge eating disorder - Plan: lisdexamfetamine (VYVANSE) 30 MG capsule ? ?Morbid obesity ([place]) - Plan: lisdexamfetamine (VYVANSE) 30 MG capsule ? ?Mixed anxiety and depressive disorder - Plan: FLUoxetine (PROZAC) 40 MG capsule ? ?Going to trial Vyvanse 30 mg daily for binging disorder.  Possible side effects discussed.  Advised to increase water intake.  We will reconvene in 1 month ? ?Anxiety and depressive disorder are not controlled at this time so we will advance to 40 mg daily. ? ?1 month follow-up has been scheduled, she will contact me prior to that visit if needed ? ?No orders of the defined types were placed in this encounter. ? ?No orders of the defined types were placed in this encounter. ? ? ? ?[name], DO ?[place] ?[phone] ? ?

## 2021-08-02 NOTE — Patient Instructions (Signed)
Controlled Substance Guidelines:  1. You cannot get an early refill, even it is lost.  2. You cannot get controlled medications from any other doctor, unless it is the emergency department and related to a new problem or injury.  3. You cannot use alcohol, marijuana, cocaine or any other recreational drugs while using this medication. This is very dangerous.  4. You are willing to have your urine drug tested at each visit.  5. You will not drive while using this medication, because that can put yourself and others in serious danger of an accident. 6. If any medication is stolen, then there must be a police report to verify it, or it cannot be refilled.  7. I will not prescribe these medications for longer than 3 months.  8. You must bring your pill bottle to each visit.  9. You must use the same pharmacy for all refills for the medication, unless you clear it with me beforehand.  10. You cannot share or sell this medication.   

## 2021-08-10 ENCOUNTER — Encounter: Payer: Self-pay | Admitting: Family Medicine

## 2021-09-04 ENCOUNTER — Ambulatory Visit: Payer: 59 | Admitting: Family Medicine

## 2021-09-05 ENCOUNTER — Encounter: Payer: Self-pay | Admitting: Family Medicine

## 2021-12-21 ENCOUNTER — Ambulatory Visit (INDEPENDENT_AMBULATORY_CARE_PROVIDER_SITE_OTHER): Payer: 59 | Admitting: Nurse Practitioner

## 2021-12-21 ENCOUNTER — Encounter: Payer: Self-pay | Admitting: Nurse Practitioner

## 2021-12-21 ENCOUNTER — Other Ambulatory Visit (HOSPITAL_COMMUNITY)
Admission: RE | Admit: 2021-12-21 | Discharge: 2021-12-21 | Disposition: A | Discharge status: Home / Self Care | Payer: 59 | Source: Ambulatory Visit | Attending: Nurse Practitioner | Admitting: Nurse Practitioner

## 2021-12-21 VITALS — BP 109/73 | HR 7 | Temp 97.4°F | Resp 20 | Ht 67.0 in | Wt 256.0 lb

## 2021-12-21 DIAGNOSIS — Z7251 High risk heterosexual behavior: Secondary | ICD-10-CM | POA: Diagnosis not present

## 2021-12-21 NOTE — Progress Notes (Signed)
   Subjective:    Patient ID: Peggy Ford, female    DOB: Sep 12, 1995, 26 y.o.   MRN: [id number]   Chief Complaint: STD testing   HPI  Patient come sin wanted to be tested for STD. SHe has had multiple partners lately and just wants to be safe. She denies any vaginal discharge. Unaware of any specific STD exposure.    Review of Systems  Constitutional:  Negative for diaphoresis.  Eyes:  Negative for pain.  Respiratory:  Negative for shortness of breath.   Cardiovascular:  Negative for chest pain, palpitations and leg swelling.  Gastrointestinal:  Negative for abdominal pain.  Endocrine: Negative for polydipsia.  Genitourinary:  Negative for vaginal bleeding, vaginal discharge and vaginal pain.  Skin:  Negative for rash.  Neurological:  Negative for dizziness, weakness and headaches.  Hematological:  Does not bruise/bleed easily.  All other systems reviewed and are negative.      Objective:   Physical Exam Constitutional:      Appearance: Normal appearance. She is obese.  Cardiovascular:     Rate and Rhythm: Normal rate and regular rhythm.     Heart sounds: Normal heart sounds.  Pulmonary:     Effort: Pulmonary effort is normal.     Breath sounds: Normal breath sounds.  Skin:    General: Skin is warm.  Neurological:     General: No focal deficit present.     Mental Status: She is alert and oriented to person, place, and time.  Psychiatric:        Mood and Affect: Mood normal.        Behavior: Behavior normal.     BP 109/73   Pulse (!) 7   Temp (!) 97.4 F (36.3 C) (Oral)   Resp 20   Ht '5\' 7"'$  (1.702 m)   Wt 256 lb (116.1 kg)   SpO2 100%   BMI 40.10 kg/m        Assessment & Plan:   Peggy Ford in today with chief complaint of STD testing   1. High risk heterosexual behavior Labs pending Handout on safe sex - HepB+HepC+HIV Panel - Urine cytology ancillary only - HIV antibody (with reflex)    The above assessment and management plan was  discussed with the patient. The patient verbalized understanding of and has agreed to the management plan. Patient is aware to call the clinic if symptoms persist or worsen. Patient is aware when to return to the clinic for a follow-up visit. Patient educated on when it is appropriate to go to the emergency department.   Mary-Margaret Hassell Done, FNP

## 2021-12-21 NOTE — Patient Instructions (Signed)
Safe Sex Practicing safe sex means taking steps before and during sex to reduce your risk of: Getting an STI (sexually transmitted infection). Giving your partner an STI. Unwanted or unplanned pregnancy. How to practice safe sex Ways you can practice safe sex  Limit your sexual partners to only one partner who is having sex with only you. Avoid using alcohol and drugs before having sex. Alcohol and drugs can affect your judgment. Before having sex with a new partner: Talk to your partner about past partners, past STIs, and drug use. Get screened for STIs and discuss the results with your partner. Ask your partner to get screened too. Check your body regularly for sores, blisters, rashes, or unusual discharge. If you notice any of these problems, visit your health care provider. Avoid sexual contact if you have symptoms of an infection or you are being treated for an STI. While having sex, use a condom. Make sure to: Use a condom every time you have vaginal, oral, or anal sex. Both females and males should wear condoms during oral sex. Keep condoms in place from the beginning to the end of sexual activity. Use a latex condom, if possible. Latex condoms offer the best protection. Use only water-based lubricants with a condom. Using petroleum-based lubricants or oils will weaken the condom and increase the chance that it will break. Ways your health care provider can help you practice safe sex  See your health care provider for regular screenings, exams, and tests for STIs. Talk with your health care provider about what kind of birth control (contraception) is best for you. Get vaccinated against hepatitis B and human papillomavirus (HPV). If you are at risk of being infected with HIV (human immunodeficiency virus), talk with your health care provider about taking a prescription medicine to prevent HIV infection. You are at risk for HIV if you: Are a man who has sex with other men. Are  sexually active with more than one partner. Take drugs by injection. Have a sex partner who has HIV. Have unprotected sex. Have sex with someone who has sex with both men and women. Have had an STI. Follow these instructions at home: Take over-the-counter and prescription medicines only as told by your health care provider. Keep all follow-up visits. This is important. Where to find more information Centers for Disease Control and Prevention: www.cdc.gov Planned Parenthood: www.plannedparenthood.org Office on Women's Health: www.womenshealth.gov Summary Practicing safe sex means taking steps before and during sex to reduce your risk getting an STI, giving your partner an STI, and having an unwanted or unplanned pregnancy. Before having sex with a new partner, talk to your partner about past partners, past STIs, and drug use. Use a condom every time you have vaginal, oral, or anal sex. Both females and males should wear condoms during oral sex. Check your body regularly for sores, blisters, rashes, or unusual discharge. If you notice any of these problems, visit your health care provider. See your health care provider for regular screenings, exams, and tests for STIs. This information is not intended to replace advice given to you by your health care provider. Make sure you discuss any questions you have with your health care provider. Document Revised: 09/28/2019 Document Reviewed: 09/28/2019 Elsevier Patient Education  2023 Elsevier Inc.  

## 2021-12-22 LAB — HEPB+HEPC+HIV PANEL
HIV Screen 4th Generation wRfx: NONREACTIVE
Hep B C IgM: NEGATIVE
Hep B Core Total Ab: NEGATIVE
Hep B E Ab: NEGATIVE
Hep B E Ag: NEGATIVE
Hep B Surface Ab, Qual: NONREACTIVE
Hep C Virus Ab: NONREACTIVE
Hepatitis B Surface Ag: NEGATIVE

## 2021-12-22 LAB — URINE CYTOLOGY ANCILLARY ONLY
Chlamydia: NEGATIVE
Comment: NEGATIVE
Comment: NEGATIVE
Comment: NORMAL
Neisseria Gonorrhea: NEGATIVE
Trichomonas: NEGATIVE

## 2022-01-25 ENCOUNTER — Encounter: Payer: Self-pay | Admitting: Family Medicine

## 2022-01-25 ENCOUNTER — Ambulatory Visit (INDEPENDENT_AMBULATORY_CARE_PROVIDER_SITE_OTHER): Payer: 59 | Admitting: Family Medicine

## 2022-01-25 VITALS — BP 139/74 | HR 89 | Temp 97.9°F | Ht 67.0 in | Wt 260.4 lb

## 2022-01-25 DIAGNOSIS — Z113 Encounter for screening for infections with a predominantly sexual mode of transmission: Secondary | ICD-10-CM | POA: Diagnosis not present

## 2022-01-25 NOTE — Progress Notes (Signed)
   Assessment & Plan:  1. Routine screening for STI (sexually transmitted infection) - NuSwab Vaginitis Plus (VG+) - HepB+HepC+HIV Panel - RPR   Follow up plan: Return if symptoms worsen or fail to improve.  Hendricks Limes, MSN, APRN, FNP-C [facility]  Subjective:   Patient ID: Peggy Ford, female    DOB: 12/25/1995, 26 y.o.   MRN: [id number]  HPI: Peggy Ford is a 26 y.o. female presenting on 01/25/2022 for Exposure to STD  Sexually Transmitted Disease Check: Patient presents for sexually transmitted disease check. Sexual history reviewed with the patient. STD exposure: multiple sexual partners:  11  in past  few months .  Previous history of STD:  trichomonas. Current symptoms include none.  Contraception: IUD.   ROS: Negative unless specifically indicated above in HPI.   Relevant past medical history reviewed and updated as indicated.   Allergies and medications reviewed and updated.   Current Outpatient Medications:    FLUoxetine (PROZAC) 40 MG capsule, Take 1 capsule (40 mg total) by mouth daily., Disp: 90 capsule, Rfl: 3   hydrOXYzine (ATARAX/VISTARIL) 50 MG tablet, Take 0.5-1 tablets (25-50 mg total) by mouth every 8 (eight) hours as needed for anxiety., Disp: 30 tablet, Rfl: 3   lisdexamfetamine (VYVANSE) 30 MG capsule, Take 1 capsule (30 mg total) by mouth daily., Disp: 30 capsule, Rfl: 0  No Known Allergies  Objective:   BP 139/74   Pulse 89   Temp 97.9 F (36.6 C)   Ht '5\' 7"'$  (1.702 m)   Wt 260 lb 6.4 oz (118.1 kg)   LMP 01/19/2022   SpO2 95%   BMI 40.78 kg/m    Physical Exam Vitals reviewed.  Constitutional:      General: She is not in acute distress.    Appearance: Normal appearance. She is morbidly obese. She is not ill-appearing, toxic-appearing or diaphoretic.  HENT:     Head: Normocephalic and atraumatic.  Eyes:     General: No scleral icterus.       Right eye: No discharge.        Left eye: No discharge.      Conjunctiva/sclera: Conjunctivae normal.  Cardiovascular:     Rate and Rhythm: Normal rate.  Pulmonary:     Effort: Pulmonary effort is normal. No respiratory distress.  Musculoskeletal:        General: Normal range of motion.     Cervical back: Normal range of motion.  Skin:    General: Skin is warm and dry.     Capillary Refill: Capillary refill takes less than 2 seconds.  Neurological:     General: No focal deficit present.     Mental Status: She is alert and oriented to person, place, and time. Mental status is at baseline.  Psychiatric:        Mood and Affect: Mood normal.        Behavior: Behavior normal.        Thought Content: Thought content normal.        Judgment: Judgment normal.

## 2022-01-27 LAB — HEPB+HEPC+HIV PANEL
HIV Screen 4th Generation wRfx: NONREACTIVE
Hep B C IgM: NEGATIVE
Hep B Core Total Ab: NEGATIVE
Hep B E Ab: NEGATIVE
Hep B E Ag: NEGATIVE
Hep B Surface Ab, Qual: NONREACTIVE
Hep C Virus Ab: NONREACTIVE
Hepatitis B Surface Ag: NEGATIVE

## 2022-01-27 LAB — RPR: RPR Ser Ql: NONREACTIVE

## 2022-01-28 LAB — NUSWAB VAGINITIS PLUS (VG+)
Candida albicans, NAA: NEGATIVE
Candida glabrata, NAA: NEGATIVE
Chlamydia trachomatis, NAA: NEGATIVE
Neisseria gonorrhoeae, NAA: NEGATIVE
Trich vag by NAA: NEGATIVE

## 2022-02-05 ENCOUNTER — Encounter: Payer: Self-pay | Admitting: Family Medicine

## 2022-02-05 ENCOUNTER — Ambulatory Visit (INDEPENDENT_AMBULATORY_CARE_PROVIDER_SITE_OTHER): Payer: 59 | Admitting: Family Medicine

## 2022-02-05 VITALS — BP 104/72 | HR 67 | Temp 97.7°F | Resp 20 | Ht 67.0 in | Wt 258.0 lb

## 2022-02-05 DIAGNOSIS — Z113 Encounter for screening for infections with a predominantly sexual mode of transmission: Secondary | ICD-10-CM

## 2022-02-05 DIAGNOSIS — N760 Acute vaginitis: Secondary | ICD-10-CM

## 2022-02-05 DIAGNOSIS — N898 Other specified noninflammatory disorders of vagina: Secondary | ICD-10-CM | POA: Diagnosis not present

## 2022-02-05 DIAGNOSIS — B9689 Other specified bacterial agents as the cause of diseases classified elsewhere: Secondary | ICD-10-CM | POA: Diagnosis not present

## 2022-02-05 LAB — WET PREP FOR TRICH, YEAST, CLUE
Clue Cell Exam: POSITIVE — AB
Trichomonas Exam: NEGATIVE
Yeast Exam: NEGATIVE

## 2022-02-05 MED ORDER — METRONIDAZOLE 500 MG PO TABS: 500.0000 mg | ORAL_TABLET | Freq: Two times a day (BID) | ORAL | 0 refills | Status: DC

## 2022-02-05 NOTE — Progress Notes (Signed)
   Assessment & Plan:  1. Routine screening for STI (sexually transmitted infection) - NuSwab Vaginitis Plus (VG+) - HepB+HepC+HIV Panel - RPR  2. Vaginal itching - WET PREP FOR Daisy, YEAST, CLUE - positive for clue cells.  3. Bacterial vaginosis Education provided on BV. - metroNIDAZOLE (FLAGYL) 500 MG tablet; Take 1 tablet (500 mg total) by mouth 2 (two) times daily.  Dispense: 14 tablet; Refill: 0   Follow up plan: Return if symptoms worsen or fail to improve.  Hendricks Limes, MSN, APRN, FNP-C [facility]  Subjective:   Patient ID: Peggy Ford, female    DOB: 02/09/1996, 26 y.o.   MRN: [id number]  HPI: Peggy Ford is a 26 y.o. female presenting on 02/05/2022 for Screening for STI  Patient presents for sexually transmitted disease check. Sexual history reviewed with the patient. STD exposure: multiple sex partners; none with known STI.  Previous history of STD:  trichomonas. Current symptoms include vaginal itching and discomfort.  Contraception: IUD.   ROS: Negative unless specifically indicated above in HPI.   Relevant past medical history reviewed and updated as indicated.   Allergies and medications reviewed and updated.   Current Outpatient Medications:    FLUoxetine (PROZAC) 40 MG capsule, Take 1 capsule (40 mg total) by mouth daily., Disp: 90 capsule, Rfl: 3   hydrOXYzine (ATARAX/VISTARIL) 50 MG tablet, Take 0.5-1 tablets (25-50 mg total) by mouth every 8 (eight) hours as needed for anxiety., Disp: 30 tablet, Rfl: 3   lisdexamfetamine (VYVANSE) 30 MG capsule, Take 1 capsule (30 mg total) by mouth daily., Disp: 30 capsule, Rfl: 0  No Known Allergies  Objective:   BP 104/72   Pulse 67   Temp 97.7 F (36.5 C)   Resp 20   Ht '5\' 7"'$  (1.702 m)   Wt 258 lb (117 kg)   LMP 01/19/2022   SpO2 99%   BMI 40.41 kg/m    Physical Exam Vitals reviewed.  Constitutional:      General: She is not in acute distress.    Appearance: Normal  appearance. She is not ill-appearing, toxic-appearing or diaphoretic.  HENT:     Head: Normocephalic and atraumatic.  Eyes:     General: No scleral icterus.       Right eye: No discharge.        Left eye: No discharge.     Conjunctiva/sclera: Conjunctivae normal.  Cardiovascular:     Rate and Rhythm: Normal rate.  Pulmonary:     Effort: Pulmonary effort is normal. No respiratory distress.  Musculoskeletal:        General: Normal range of motion.     Cervical back: Normal range of motion.  Skin:    General: Skin is warm and dry.     Capillary Refill: Capillary refill takes less than 2 seconds.  Neurological:     General: No focal deficit present.     Mental Status: She is alert and oriented to person, place, and time. Mental status is at baseline.  Psychiatric:        Mood and Affect: Mood normal.        Behavior: Behavior normal.        Thought Content: Thought content normal.        Judgment: Judgment normal.

## 2022-02-06 LAB — HEPB+HEPC+HIV PANEL
HIV Screen 4th Generation wRfx: NONREACTIVE
Hep B C IgM: NEGATIVE
Hep B Core Total Ab: NEGATIVE
Hep B E Ab: NEGATIVE
Hep B E Ag: NEGATIVE
Hep B Surface Ab, Qual: NONREACTIVE
Hep C Virus Ab: NONREACTIVE
Hepatitis B Surface Ag: NEGATIVE

## 2022-02-06 LAB — RPR: RPR Ser Ql: NONREACTIVE

## 2022-02-07 LAB — NUSWAB VAGINITIS PLUS (VG+)
Candida albicans, NAA: NEGATIVE
Candida glabrata, NAA: NEGATIVE
Chlamydia trachomatis, NAA: NEGATIVE
Megasphaera 1: HIGH Score — AB
Neisseria gonorrhoeae, NAA: NEGATIVE
Trich vag by NAA: NEGATIVE

## 2022-03-02 ENCOUNTER — Other Ambulatory Visit: Payer: Self-pay | Admitting: Nurse Practitioner

## 2022-03-02 ENCOUNTER — Telehealth (INDEPENDENT_AMBULATORY_CARE_PROVIDER_SITE_OTHER): Payer: 59 | Admitting: Nurse Practitioner

## 2022-03-02 ENCOUNTER — Encounter: Payer: Self-pay | Admitting: Nurse Practitioner

## 2022-03-02 DIAGNOSIS — R300 Dysuria: Secondary | ICD-10-CM

## 2022-03-02 LAB — URINALYSIS, COMPLETE
Bilirubin, UA: NEGATIVE
Glucose, UA: NEGATIVE
Ketones, UA: NEGATIVE
Nitrite, UA: NEGATIVE
Protein,UA: NEGATIVE
RBC, UA: NEGATIVE
Specific Gravity, UA: <1.005 — ABNORMAL LOW (ref 1.005–1.030)
Urobilinogen, Ur: 0.2 mg/dL (ref 0.2–1.0)
pH, UA: 6 (ref 5.0–7.5)

## 2022-03-02 LAB — MICROSCOPIC EXAMINATION
RBC, Urine: NONE SEEN /hpf (ref 0–2)
Renal Epithel, UA: NONE SEEN /hpf

## 2022-03-02 LAB — PREGNANCY, URINE: Preg Test, Ur: NEGATIVE

## 2022-03-02 MED ORDER — NITROFURANTOIN MONOHYD MACRO 100 MG PO CAPS: 100.0000 mg | ORAL_CAPSULE | Freq: Two times a day (BID) | ORAL | 0 refills | Status: DC

## 2022-03-02 MED ORDER — PHENAZOPYRIDINE HCL 95 MG PO TABS: 95.0000 mg | ORAL_TABLET | Freq: Three times a day (TID) | ORAL | 0 refills | Status: DC | PRN

## 2022-03-02 NOTE — Progress Notes (Signed)
   Virtual Visit  Note Due to COVID-19 pandemic this visit was conducted virtually. This visit type was conducted due to national recommendations for restrictions regarding the COVID-19 Pandemic (e.g. social distancing, sheltering in place) in an effort to limit this patient's exposure and mitigate transmission in our community. All issues noted in this document were discussed and addressed.  A physical exam was not performed with this format.  I connected with Peggy Ford on 03/02/22 at 9;30 am by telephone and verified that I am speaking with the correct person using two identifiers. Peggy Ford is currently located in her car during visit. The provider, Ivy Lynn, NP is located in their office at time of visit.  I discussed the limitations, risks, security and privacy concerns of performing an evaluation and management service by telephone and the availability of in person appointments. I also discussed with the patient that there may be a patient responsible charge related to this service. The patient expressed understanding and agreed to proceed.   History and Present Illness:  Dysuria  This is a new problem. Episode onset: In the past 2 to 3 days. The problem occurs every urination. The problem has been unchanged. The quality of the pain is described as burning and aching. The pain is moderate. There has been no fever. She is Sexually active. There is No history of pyelonephritis. Associated symptoms include frequency and urgency. Pertinent negatives include no chills, discharge, flank pain or nausea. She has tried nothing for the symptoms.      Review of Systems  Constitutional: Negative.  Negative for chills.  HENT: Negative.    Cardiovascular: Negative.   Gastrointestinal:  Positive for abdominal pain. Negative for nausea.  Genitourinary:  Positive for dysuria, frequency and urgency. Negative for flank pain.  Skin: Negative.  Negative for rash.  All other systems  reviewed and are negative.    Observations/Objective: Televisit patient not in distress  Assessment and Plan: Patient presents with symptoms of urinary burning, hematuria, and urinary frequency for the past 24 to 48 hours. UTI  vs  interstitial cystitis -urinalysis completed results pending -pyridium for pain -Precaution and education provided -All questions answered   Follow Up Instructions: Follow-up with worsening unresolved symptoms    I discussed the assessment and treatment plan with the patient. The patient was provided an opportunity to ask questions and all were answered. The patient agreed with the plan and demonstrated an understanding of the instructions.   The patient was advised to call back or seek an in-person evaluation if the symptoms worsen or if the condition fails to improve as anticipated.  The above assessment and management plan was discussed with the patient. The patient verbalized understanding of and has agreed to the management plan. Patient is aware to call the clinic if symptoms persist or worsen. Patient is aware when to return to the clinic for a follow-up visit. Patient educated on when it is appropriate to go to the emergency department.   Time call ended: 9:42 AM  I provided 12 minutes of  non face-to-face time during this encounter.    Ivy Lynn, NP

## 2022-03-02 NOTE — Patient Instructions (Signed)

## 2022-03-05 LAB — URINE CULTURE

## 2022-03-23 ENCOUNTER — Ambulatory Visit (INDEPENDENT_AMBULATORY_CARE_PROVIDER_SITE_OTHER): Payer: 59 | Admitting: Nurse Practitioner

## 2022-03-23 ENCOUNTER — Other Ambulatory Visit: Payer: Self-pay | Admitting: Nurse Practitioner

## 2022-03-23 ENCOUNTER — Encounter: Payer: Self-pay | Admitting: Nurse Practitioner

## 2022-03-23 VITALS — BP 99/66 | HR 77 | Temp 98.4°F | Ht 67.0 in | Wt 267.8 lb

## 2022-03-23 DIAGNOSIS — N898 Other specified noninflammatory disorders of vagina: Secondary | ICD-10-CM

## 2022-03-23 DIAGNOSIS — Z113 Encounter for screening for infections with a predominantly sexual mode of transmission: Secondary | ICD-10-CM

## 2022-03-23 LAB — URINALYSIS, ROUTINE W REFLEX MICROSCOPIC
Bilirubin, UA: NEGATIVE
Glucose, UA: NEGATIVE
Ketones, UA: NEGATIVE
Leukocytes,UA: NEGATIVE
Nitrite, UA: NEGATIVE
Protein,UA: NEGATIVE
RBC, UA: NEGATIVE
Specific Gravity, UA: 1.025 (ref 1.005–1.030)
Urobilinogen, Ur: 0.2 mg/dL (ref 0.2–1.0)
pH, UA: 6 (ref 5.0–7.5)

## 2022-03-23 LAB — WET PREP FOR TRICH, YEAST, CLUE
Clue Cell Exam: POSITIVE — AB
Trichomonas Exam: NEGATIVE
Yeast Exam: POSITIVE — AB

## 2022-03-23 MED ORDER — METRONIDAZOLE 500 MG PO TABS: 500.0000 mg | ORAL_TABLET | Freq: Two times a day (BID) | ORAL | 0 refills | Status: DC

## 2022-03-23 MED ORDER — FLUCONAZOLE 150 MG PO TABS: 150.0000 mg | ORAL_TABLET | Freq: Once | ORAL | 0 refills | Status: AC

## 2022-03-23 NOTE — Patient Instructions (Signed)
Safe Sex Practicing safe sex means taking steps before and during sex to reduce your risk of: Getting an STI (sexually transmitted infection). Giving your partner an STI. Unwanted or unplanned pregnancy. How to practice safe sex Ways you can practice safe sex  Limit your sexual partners to only one partner who is having sex with only you. Avoid using alcohol and drugs before having sex. Alcohol and drugs can affect your judgment. Before having sex with a new partner: Talk to your partner about past partners, past STIs, and drug use. Get screened for STIs and discuss the results with your partner. Ask your partner to get screened too. Check your body regularly for sores, blisters, rashes, or unusual discharge. If you notice any of these problems, visit your health care provider. Avoid sexual contact if you have symptoms of an infection or you are being treated for an STI. While having sex, use a condom. Make sure to: Use a condom every time you have vaginal, oral, or anal sex. Both females and males should wear condoms during oral sex. Keep condoms in place from the beginning to the end of sexual activity. Use a latex condom, if possible. Latex condoms offer the best protection. Use only water-based lubricants with a condom. Using petroleum-based lubricants or oils will weaken the condom and increase the chance that it will break. Ways your health care provider can help you practice safe sex  See your health care provider for regular screenings, exams, and tests for STIs. Talk with your health care provider about what kind of birth control (contraception) is best for you. Get vaccinated against hepatitis B and human papillomavirus (HPV). If you are at risk of being infected with HIV (human immunodeficiency virus), talk with your health care provider about taking a prescription medicine to prevent HIV infection. You are at risk for HIV if you: Are a man who has sex with other men. Are  sexually active with more than one partner. Take drugs by injection. Have a sex partner who has HIV. Have unprotected sex. Have sex with someone who has sex with both men and women. Have had an STI. Follow these instructions at home: Take over-the-counter and prescription medicines only as told by your health care provider. Keep all follow-up visits. This is important. Where to find more information Centers for Disease Control and Prevention: www.cdc.gov Planned Parenthood: www.plannedparenthood.org Office on Women's Health: www.womenshealth.gov Summary Practicing safe sex means taking steps before and during sex to reduce your risk getting an STI, giving your partner an STI, and having an unwanted or unplanned pregnancy. Before having sex with a new partner, talk to your partner about past partners, past STIs, and drug use. Use a condom every time you have vaginal, oral, or anal sex. Both females and males should wear condoms during oral sex. Check your body regularly for sores, blisters, rashes, or unusual discharge. If you notice any of these problems, visit your health care provider. See your health care provider for regular screenings, exams, and tests for STIs. This information is not intended to replace advice given to you by your health care provider. Make sure you discuss any questions you have with your health care provider. Document Revised: 09/28/2019 Document Reviewed: 09/28/2019 Elsevier Patient Education  2023 Elsevier Inc.  

## 2022-03-23 NOTE — Progress Notes (Signed)
Acute Office Visit  Subjective:     Patient ID: Peggy Ford, female    DOB: 11-May-1995, 26 y.o.   MRN: [id number]  Chief Complaint  Patient presents with   Exposure to STD    Exposure having symptoms    Vaginal Discharge The patient's primary symptoms include pelvic pain and vaginal discharge. The patient's pertinent negatives include no genital itching or genital rash. This is a new problem. The current episode started in the past 7 days. The problem occurs constantly. Associated symptoms include abdominal pain and back pain. Pertinent negatives include no chills, constipation, diarrhea, dysuria, flank pain, headaches, hematuria, nausea, rash or vomiting. There has been no bleeding. She is sexually active. It is unknown whether or not her partner has an STD. She uses oral contraceptives for contraception. Her menstrual history has been regular.    Review of Systems  Constitutional: Negative.  Negative for chills.  HENT: Negative.    Eyes: Negative.   Respiratory: Negative.    Cardiovascular: Negative.   Gastrointestinal:  Positive for abdominal pain. Negative for constipation, diarrhea, nausea and vomiting.  Genitourinary:  Positive for pelvic pain and vaginal discharge. Negative for dysuria, flank pain and hematuria.       Vaginal discharge  Musculoskeletal:  Positive for back pain.  Skin: Negative.  Negative for rash.  Neurological: Negative.  Negative for headaches.  Psychiatric/Behavioral: Negative.    All other systems reviewed and are negative.       Objective:    BP 99/66   Pulse 77   Temp 98.4 F (36.9 C) (Temporal)   Ht '5\' 7"'$  (1.702 m)   Wt 267 lb 12.8 oz (121.5 kg)   SpO2 97%   BMI 41.94 kg/m  BP Readings from Last 3 Encounters:  03/23/22 99/66  02/05/22 104/72  01/25/22 139/74   Wt Readings from Last 3 Encounters:  03/23/22 267 lb 12.8 oz (121.5 kg)  02/05/22 258 lb (117 kg)  01/25/22 260 lb 6.4 oz (118.1 kg)      Physical Exam Vitals and  nursing note reviewed.  Constitutional:      Appearance: Normal appearance.  HENT:     Head: Normocephalic.     Right Ear: External ear normal.     Left Ear: External ear normal.  Eyes:     Conjunctiva/sclera: Conjunctivae normal.  Cardiovascular:     Rate and Rhythm: Normal rate and regular rhythm.     Pulses: Normal pulses.     Heart sounds: Normal heart sounds.  Abdominal:     General: Bowel sounds are normal. There is no distension.     Tenderness: There is abdominal tenderness. There is no right CVA tenderness or left CVA tenderness.  Neurological:     General: No focal deficit present.     Mental Status: She is alert and oriented to person, place, and time.  Psychiatric:        Mood and Affect: Mood normal.        Behavior: Behavior normal.     No results found for any visits on 03/23/22.      Assessment & Plan:  Patient presents with lower abdominal pain, vaginal discharge and unprotected sex encounter.  Patient denies fever, nausea, vomiting and body ache.  I provided education on safe sex, STD labs completed results pending.  Will treat patient when lab results return.  Follow-up with worsening unresolved symptoms.  Problem List Items Addressed This Visit   None Visit Diagnoses  Screen for STD (sexually transmitted disease)    -  Primary   Relevant Orders   WET PREP FOR TRICH, YEAST, CLUE   Ct, Ng, Mycoplasmas NAA, Urine   Urinalysis, Routine w reflex microscopic   CULTURE, URINE COMPREHENSIVE   Vaginal discharge       Relevant Orders   WET PREP FOR TRICH, YEAST, CLUE   WET PREP FOR TRICH, YEAST, CLUE   Ct, Ng, Mycoplasmas NAA, Urine       No orders of the defined types were placed in this encounter.   Return if symptoms worsen or fail to improve.  Ivy Lynn, NP

## 2022-03-26 ENCOUNTER — Ambulatory Visit: Payer: 59 | Admitting: Nurse Practitioner

## 2022-03-26 ENCOUNTER — Other Ambulatory Visit: Payer: Self-pay | Admitting: Nurse Practitioner

## 2022-03-26 DIAGNOSIS — Z2239 Carrier of other specified bacterial diseases: Secondary | ICD-10-CM

## 2022-03-26 MED ORDER — AZITHROMYCIN 250 MG PO TABS: ORAL_TABLET | ORAL | 0 refills | Status: AC

## 2022-03-27 ENCOUNTER — Telehealth: Payer: Self-pay | Admitting: Family Medicine

## 2022-03-27 LAB — CULTURE, URINE COMPREHENSIVE

## 2022-03-27 NOTE — Telephone Encounter (Signed)
PT AWARE OF LABS

## 2022-03-27 NOTE — Progress Notes (Signed)
Patient returning call. Please call back

## 2022-03-27 NOTE — Telephone Encounter (Signed)
Patient calling about urinalysis. Accidentally took out of results tab, please call back.

## 2022-03-28 LAB — CT, NG, MYCOPLASMAS NAA, URINE
Chlamydia trachomatis, NAA: NEGATIVE
Mycoplasma genitalium NAA: NEGATIVE
Mycoplasma hominis NAA: NEGATIVE
Neisseria gonorrhoeae, NAA: NEGATIVE
Ureaplasma spp NAA: POSITIVE — AB

## 2022-04-09 ENCOUNTER — Other Ambulatory Visit (INDEPENDENT_AMBULATORY_CARE_PROVIDER_SITE_OTHER): Payer: 59

## 2022-04-09 ENCOUNTER — Encounter: Payer: Self-pay | Admitting: Family

## 2022-04-09 ENCOUNTER — Ambulatory Visit (INDEPENDENT_AMBULATORY_CARE_PROVIDER_SITE_OTHER): Payer: 59 | Admitting: Family

## 2022-04-09 VITALS — BP 108/69 | HR 92 | Temp 97.4°F | Ht 67.0 in | Wt 264.4 lb

## 2022-04-09 DIAGNOSIS — I493 Ventricular premature depolarization: Secondary | ICD-10-CM

## 2022-04-09 DIAGNOSIS — R079 Chest pain, unspecified: Secondary | ICD-10-CM | POA: Diagnosis not present

## 2022-04-09 DIAGNOSIS — F418 Other specified anxiety disorders: Secondary | ICD-10-CM

## 2022-04-09 DIAGNOSIS — R002 Palpitations: Secondary | ICD-10-CM | POA: Diagnosis not present

## 2022-04-09 MED ORDER — FLUOXETINE HCL 40 MG PO CAPS: 40.0000 mg | ORAL_CAPSULE | Freq: Every day | ORAL | 3 refills | Status: DC

## 2022-04-09 NOTE — Progress Notes (Signed)
Subjective:    Patient ID: Peggy Ford, female    DOB: 10/18/1995, 26 y.o.   MRN: [id number]  Chief Complaint  Patient presents with   Chest Pain   Headache   Palpitations   PT presents to the office today with palpitations that started 5 days ago that has been coming and going. Reports now it has worsen and staying for 30-45 mins at a time with chest tightness and SOB.  Chest Pain  Associated symptoms include dizziness, headaches, irregular heartbeat, malaise/fatigue, palpitations and shortness of breath. Pertinent negatives include no fever, nausea, vomiting or weakness.  Headache  Associated symptoms include dizziness. Pertinent negatives include no fever, nausea, vomiting or weakness.  Palpitations  This is a new problem. The current episode started 1 to 4 weeks ago. The problem occurs intermittently. On average, each episode lasts 30 minutes. Nothing aggravates the symptoms. Associated symptoms include anxiety, chest fullness, chest pain, dizziness, an irregular heartbeat, malaise/fatigue and shortness of breath. Pertinent negatives include no fever, nausea, vomiting or weakness. She has tried nothing for the symptoms. The treatment provided mild relief.  Anxiety Presents for follow-up visit. Symptoms include chest pain, dizziness, excessive worry, irritability, nervous/anxious behavior, palpitations and shortness of breath. Patient reports no nausea. Symptoms occur most days. The severity of symptoms is moderate.        Review of Systems  Constitutional:  Positive for irritability and malaise/fatigue. Negative for fever.  Respiratory:  Positive for shortness of breath.   Cardiovascular:  Positive for chest pain and palpitations.  Gastrointestinal:  Negative for nausea and vomiting.  Neurological:  Positive for dizziness and headaches. Negative for weakness.  Psychiatric/Behavioral:  The patient is nervous/anxious.   All other systems reviewed and are negative.       Objective:   Physical Exam Vitals reviewed.  Constitutional:      General: She is not in acute distress.    Appearance: She is well-developed. She is obese.  HENT:     Head: Normocephalic and atraumatic.     Right Ear: External ear normal.  Eyes:     Pupils: Pupils are equal, round, and reactive to light.  Neck:     Thyroid: No thyromegaly.  Cardiovascular:     Rate and Rhythm: Normal rate and regular rhythm.     Heart sounds: Normal heart sounds. No murmur heard. Pulmonary:     Effort: Pulmonary effort is normal. No respiratory distress.     Breath sounds: Normal breath sounds. No wheezing.  Abdominal:     General: Bowel sounds are normal. There is no distension.     Palpations: Abdomen is soft.     Tenderness: There is no abdominal tenderness.  Musculoskeletal:        General: No tenderness. Normal range of motion.     Cervical back: Normal range of motion and neck supple.  Skin:    General: Skin is warm and dry.  Neurological:     Mental Status: She is alert and oriented to person, place, and time.     Cranial Nerves: No cranial nerve deficit.     Deep Tendon Reflexes: Reflexes are normal and symmetric.  Psychiatric:        Behavior: Behavior normal.        Thought Content: Thought content normal.        Judgment: Judgment normal.       BP 108/69   Pulse 92   Temp (!) 97.4 F (36.3 C) (Temporal)   Ht  _0  (1.702 m)   Wt 264 lb 6.4 oz (119.9 kg)   SpO2 100%   BMI 41.41 kg/m      Assessment & Plan:   Peggy Ford comes in today with chief complaint of Chest Pain, Headache, and Palpitations   Diagnosis and orders addressed:  1. Chest pain, unspecified type - EKG 12-Lead - CMP14+EGFR - CBC with Differential/Platelet - TSH  2. Mixed anxiety and depressive disorder - FLUoxetine (PROZAC) 40 MG capsule; Take 1 capsule (40 mg total) by mouth daily.  Dispense: 90 capsule; Refill: 3 - CMP14+EGFR - CBC with Differential/Platelet  3. Palpitations -  CMP14+EGFR - CBC with Differential/Platelet - TSH - LONG TERM MONITOR (3-14 DAYS); Future  4. PVC (premature ventricular contraction) - CMP14+EGFR - CBC with Differential/Platelet - LONG TERM MONITOR (3-14 DAYS); Future   Labs pending She will restart Prozac to see if this helps Limit caffeine, stress, alcohol Zio applied  Follow up plan: 1 month with PCP   Peggy Dun, FNP

## 2022-04-09 NOTE — Patient Instructions (Signed)
Premature Ventricular Contraction  A premature ventricular contraction (PVC) is a type of irregular heartbeat (arrhythmia). The heart has four chambers, including the upper chambers (atria) and lower chambers (ventricles). Normally, an electrical signal starts in a group of cells called the sinoatrial node (SA node) and travels through the atria, causing them to pump blood into the ventricles. During a PVC, the heartbeat starts in one of the lower ventricles. This may cause the heartbeat to be shorter and less effective. In most cases, PVCs come and go and do not require treatment. What are the causes? Common causes of the condition include: Heart attack or coronary artery disease (CAD). Heart valve problems. Heart surgery. Infection of the heart (myocarditis). Inflammation of the heart. In many cases, the cause of this condition is not known. What increases the risk? The following factors may make you more likely to develop this condition: Age, especially being over age 75. Being female. An imbalance of salts and minerals in the body (electrolytes). Low blood oxygen levels or high carbon dioxide levels. Certain medicines, including over-the-counter and prescribed medicines. High blood pressure. Obesity. Episodes may be triggered by: Vigorous exercise. Tobacco, alcohol, or caffeine use. Illegal drug use. Emotional stress. Poor or irregular sleep. What are the signs or symptoms? The main symptoms of this condition are fast or irregular heartbeats (palpitations) or the feeling of a pause in the heartbeat. Other symptoms include: Shortness of breath. Difficulty exercising. Chest pain. Feeling tired. Dizziness. In some cases, there are no symptoms. How is this diagnosed? This condition may be diagnosed based on: Your medical history or symptoms. A physical exam. Your health care provider may listen to your heart. Tests, such as: Blood tests. An ECG (electrocardiogram) to monitor the  electrical activity of your heart. An ambulatory cardiac monitor that records your heartbeats for 24 hours or more. Stress tests to see how exercise affects your heart rhythm and blood supply. An echocardiogram, which creates an image of your heart. An electrophysiology study (EPS) to check for electrical problems in your heart. How is this treated? Treatment for this condition depends on any underlying conditions, the type of PVC, how many PVCs you have had, and if the symptoms are affecting your daily life. Possible treatments include: Avoiding things that cause PVCs (triggers). These include caffeine, tobacco, and alcohol. Taking medicines if symptoms are severe or if the arrhythmias happen a lot. Getting treatment for underlying conditions that cause PVCs. Having an implantable cardioverter defibrillator (ICD) placed in the chest to monitor the heartbeat. The monitor sends a shock to the heart if it senses an arrhythmia and brings the heartbeat back to normal. Having a catheter ablation procedure to destroy the part of the heart tissue that sends abnormal signals. In many cases, no treatment is required. Follow these instructions at home: Lifestyle  Do not use any products that contain nicotine or tobacco. These products include cigarettes, chewing tobacco, and vaping devices, such as e-cigarettes. If you need help quitting, ask your health care provider. Do not use illegal drugs. Exercise regularly. Ask your health care provider what type of exercise is safe for you. Try to get at least 7-9 hours of sleep each night. Find healthy ways to manage stress. Avoid stressful situations when possible. Alcohol use Do not drink alcohol if: Your health care provider tells you not to drink. You are pregnant, may be pregnant, or are planning to become pregnant. Alcohol triggers your episodes. If you drink alcohol: Limit how much you have to: 0-1   drink a day for women. 0-2 drinks a day for  men. Know how much alcohol is in your drink. In the [place] one drink equals one 12 oz bottle of beer (355 mL), one 5 oz glass of wine (148 mL), or one 1 oz glass of hard liquor (44 mL). General instructions Take over-the-counter and prescription medicines only as told by your health care provider. If caffeine triggers episodes of PVC, do not eat, drink, or use anything with caffeine in it. Contact a health care provider if: You feel palpitations often. You have nausea and vomiting. Get help right away if: You have chest pain. You have trouble breathing. You start sweating for no reason. You become light-headed or you faint. These symptoms may be an emergency. Get help right away. Call 911. Do not wait to see if the symptoms will go away. Do not drive yourself to the hospital. This information is not intended to replace advice given to you by your health care provider. Make sure you discuss any questions you have with your health care provider. Document Revised: 09/22/2021 Document Reviewed: 09/22/2021 Elsevier Patient Education  [place]. Palpitations Palpitations are feelings that your heartbeat is irregular or is faster than normal. It may feel like your heart is fluttering or skipping a beat. Palpitations may be caused by many things, including smoking, caffeine, alcohol, stress, and certain medicines or drugs. Most causes of palpitations are not serious.  However, some palpitations can be a sign of a serious problem. Further tests and a thorough medical history will be done to find the cause of your palpitations. Your provider may order tests such as an ECG, labs, an echocardiogram, or an ambulatory continuous ECG monitor. Follow these instructions at home: Pay attention to any changes in your symptoms. Let your health care provider know about them. Take these actions to help manage your symptoms: Eating and drinking Follow instructions from your health care provider about  eating or drinking restrictions. You may need to avoid foods and drinks that may cause palpitations. These may include: Caffeinated coffee, tea, soft drinks, and energy drinks. Chocolate. Alcohol. Diet pills. Lifestyle     Take steps to reduce your stress and anxiety. Things that can help you relax include: Yoga. Mind-body activities, such as deep breathing, meditation, or using words and images to create positive thoughts (guided imagery). Physical activity, such as swimming, jogging, or walking. Tell your health care provider if your palpitations increase with activity. If you have chest pain or shortness of breath with activity, do not continue the activity until you are seen by your health care provider. Biofeedback. This is a method that helps you learn to use your mind to control things in your body, such as your heartbeat. Get plenty of rest and sleep. Keep a regular bed time. Do not use drugs, including cocaine or ecstasy. Do not use marijuana. Do not use any products that contain nicotine or tobacco. These products include cigarettes, chewing tobacco, and vaping devices, such as e-cigarettes. If you need help quitting, ask your health care provider. General instructions Take over-the-counter and prescription medicines only as told by your health care provider. Keep all follow-up visits. This is important. These may include visits for further testing if palpitations do not go away or get worse. Contact a health care provider if: You continue to have a fast or irregular heartbeat for a long period of time. You notice that your palpitations occur more often. Get help right away if: You have  chest pain or shortness of breath. You have a severe headache. You feel dizzy or you faint. These symptoms may represent a serious problem that is an emergency. Do not wait to see if the symptoms will go away. Get medical help right away. Call your local emergency services (911 in the [place] Do  not drive yourself to the hospital. Summary Palpitations are feelings that your heartbeat is irregular or is faster than normal. It may feel like your heart is fluttering or skipping a beat. Palpitations may be caused by many things, including smoking, caffeine, alcohol, stress, certain medicines, and drugs. Further tests and a thorough medical history may be done to find the cause of your palpitations. Get help right away if you faint or have chest pain, shortness of breath, severe headache, or dizziness. This information is not intended to replace advice given to you by your health care provider. Make sure you discuss any questions you have with your health care provider. Document Revised: 09/14/2020 Document Reviewed: 09/14/2020 Elsevier Patient Education  [place].

## 2022-04-10 LAB — CBC WITH DIFFERENTIAL/PLATELET
Basophils Absolute: 0.1 10*3/uL (ref 0.0–0.2)
Basos: 1 %
EOS (ABSOLUTE): 0.1 10*3/uL (ref 0.0–0.4)
Eos: 1 %
Hematocrit: 38.3 % (ref 34.0–46.6)
Hemoglobin: 12.5 g/dL (ref 11.1–15.9)
Immature Grans (Abs): 0 10*3/uL (ref 0.0–0.1)
Immature Granulocytes: 0 %
Lymphocytes Absolute: 4.2 10*3/uL — ABNORMAL HIGH (ref 0.7–3.1)
Lymphs: 35 %
MCH: 28.7 pg (ref 26.6–33.0)
MCHC: 32.6 g/dL (ref 31.5–35.7)
MCV: 88 fL (ref 79–97)
Monocytes Absolute: 0.7 10*3/uL (ref 0.1–0.9)
Monocytes: 6 %
Neutrophils Absolute: 6.7 10*3/uL (ref 1.4–7.0)
Neutrophils: 57 %
Platelets: 326 10*3/uL (ref 150–450)
RBC: 4.35 x10E6/uL (ref 3.77–5.28)
RDW: 13.1 % (ref 11.7–15.4)
WBC: 11.9 10*3/uL — ABNORMAL HIGH (ref 3.4–10.8)

## 2022-04-10 LAB — CMP14+EGFR
ALT: 31 IU/L (ref 0–32)
AST: 23 IU/L (ref 0–40)
Albumin/Globulin Ratio: 1.7 (ref 1.2–2.2)
Albumin: 4.2 g/dL (ref 4.0–5.0)
Alkaline Phosphatase: 68 IU/L (ref 44–121)
BUN/Creatinine Ratio: 14 (ref 9–23)
BUN: 10 mg/dL (ref 6–20)
Bilirubin Total: <0.2 mg/dL (ref 0.0–1.2)
CO2: 20 mmol/L (ref 20–29)
Calcium: 9.1 mg/dL (ref 8.7–10.2)
Chloride: 104 mmol/L (ref 96–106)
Creatinine, Ser: 0.7 mg/dL (ref 0.57–1.00)
Globulin, Total: 2.5 g/dL (ref 1.5–4.5)
Glucose: 88 mg/dL (ref 70–99)
Potassium: 3.9 mmol/L (ref 3.5–5.2)
Sodium: 140 mmol/L (ref 134–144)
Total Protein: 6.7 g/dL (ref 6.0–8.5)
eGFR: 121 mL/min/{1.73_m2} (ref >59)

## 2022-04-10 LAB — TSH: TSH: 4.05 u[IU]/mL (ref 0.450–4.500)

## 2022-05-10 ENCOUNTER — Encounter: Payer: Self-pay | Admitting: *Deleted

## 2022-05-30 ENCOUNTER — Ambulatory Visit: Payer: 59 | Admitting: Family Medicine

## 2022-08-16 ENCOUNTER — Ambulatory Visit
Admission: RE | Admit: 2022-08-16 | Discharge: 2022-08-16 | Disposition: A | Discharge status: Home / Self Care | Payer: BLUE CROSS/BLUE SHIELD | Source: Ambulatory Visit | Attending: Nurse Practitioner | Admitting: Nurse Practitioner

## 2022-08-16 VITALS — BP 106/67 | HR 74 | Temp 98.2°F | Resp 18

## 2022-08-16 DIAGNOSIS — N898 Other specified noninflammatory disorders of vagina: Secondary | ICD-10-CM | POA: Diagnosis present

## 2022-08-16 LAB — POCT URINALYSIS DIP (MANUAL ENTRY)
Bilirubin, UA: NEGATIVE
Glucose, UA: NEGATIVE mg/dL
Ketones, POC UA: NEGATIVE mg/dL
Leukocytes, UA: NEGATIVE
Nitrite, UA: NEGATIVE
Protein Ur, POC: NEGATIVE mg/dL
Spec Grav, UA: >=1.03 — AB (ref 1.010–1.025)
Urobilinogen, UA: 0.2 E.U./dL
pH, UA: 6 (ref 5.0–8.0)

## 2022-08-16 NOTE — Discharge Instructions (Signed)
We will contact you if any of the testing from today comes back abnormal.  Please use condoms with every sexual encounter to prevent STI.

## 2022-08-16 NOTE — ED Provider Notes (Signed)
[facility]    CSN: [id number] Arrival date & time: 08/16/22  1333      History   Chief Complaint Chief Complaint  Patient presents with   SEXUALLY TRANSMITTED DISEASE    Excess dischargeAbdominal crampsFrequent urination - Entered by patient    HPI Peggy Ford is a 27 y.o. female.   Patient presents today for 1 day history of lower abdominal/pelvic pain and white vaginal discharge.  She reports the pain is intermittent and describes the pain as a cramping.  Pain is currently 5/10.  Pain comes and goes suddenly.  Pain does not radiate down the legs or elsewhere in her abdomen.  Reports the vaginal discharge does not seem to have an odor and denies itching, vaginal sores or lesions.  Reports she does have a new sexual partner and is concerned for STI today.  No known exposures to STI.  No fever or nausea/vomiting.  No hematuria, changes in bowel habits.   Reports she was seen at an alternate urgent care 2 days ago for urinary frequency and urgency.  She was start on Keflex but has not started taking it yet because the vaginal discharge started and she wanted to come back in and be tested for STI.  She reports a history of BV in the past.    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      Home Medications    Prior to Admission medications   Medication Sig Start Date End Date Taking? Authorizing Provider  levonorgestrel (MIRENA) 20 MCG/DAY IUD 1 each by Intrauterine route once.   Yes [provider]  FLUoxetine (PROZAC) 40 MG capsule Take 1 capsule (40 mg total) by mouth daily. 04/09/22   Jannifer Rodney A, FNP  hydrOXYzine (ATARAX/VISTARIL) 50 MG tablet Take 0.5-1 tablets (25-50 mg  total) by mouth every 8 (eight) hours as needed for anxiety. Patient not taking: Reported on 04/09/2022 12/28/20   Raliegh Ip, DO    Family History Family History  Problem Relation Age of Onset   Heart disease Other        great gm   Diabetes Other        great gf   Colon cancer Other        great gm   Irritable bowel syndrome Maternal Grandmother    Diverticulosis Paternal Grandfather     Social History Social History   Tobacco Use   Smoking status: Never   Smokeless tobacco: Never  Vaping Use   Vaping Use: Never used  Substance Use Topics   Alcohol use: No    Alcohol/week: 0.0 standard drinks of alcohol   Drug use: No     Allergies   Patient has no known allergies.   Review of Systems Review of Systems Per HPI  Physical Exam Triage Vital Signs ED Triage Vitals  Enc Vitals Group     BP 08/16/22 1337 106/67     Pulse Rate 08/16/22 1337 74     Resp 08/16/22 1337 18     Temp 08/16/22 1337 98.2 F (36.8 C)     Temp Source 08/16/22 1337 Oral     SpO2 08/16/22  1337 97 %     Weight --      Height --      Head Circumference --      Peak Flow --      Pain Score 08/16/22 1342 3     Pain Loc --      Pain Edu? --      Excl. in GC? --    No data found.  Updated Vital Signs BP 106/67 (BP Location: Right Arm)   Pulse 74   Temp 98.2 F (36.8 C) (Oral)   Resp 18   LMP 07/25/2022   SpO2 97%   Visual Acuity Right Eye Distance:   Left Eye Distance:   Bilateral Distance:    Right Eye Near:   Left Eye Near:    Bilateral Near:     Physical Exam Vitals and nursing note reviewed. Exam conducted with a chaperone present Neville Route, CMA).  Constitutional:      General: She is not in acute distress.    Appearance: Normal appearance. She is not toxic-appearing.  Pulmonary:     Effort: Pulmonary effort is normal. No respiratory distress.  Genitourinary:    General: Normal vulva.     Exam position: Lithotomy position.     Pubic Area: No rash.       Labia:        Right: No rash, tenderness, lesion or injury.        Left: No rash, tenderness, lesion or injury.      Vagina: No vaginal discharge, erythema, tenderness or bleeding.     Cervix: No cervical motion tenderness or erythema.     Adnexa: Right adnexa normal and left adnexa normal.       Right: No tenderness.         Left: No tenderness.       Comments: Minimal white discharge to vaginal vault Lymphadenopathy:     Lower Body: No right inguinal adenopathy. No left inguinal adenopathy.  Skin:    General: Skin is warm and dry.     Coloration: Skin is not jaundiced or pale.     Findings: No erythema.  Neurological:     Mental Status: She is alert and oriented to person, place, and time.     Motor: No weakness.     Gait: Gait normal.  Psychiatric:        Behavior: Behavior is cooperative.      UC Treatments / Results  Labs (all labs ordered are listed, but only abnormal results are displayed) Labs Reviewed  POCT URINALYSIS DIP (MANUAL ENTRY) - Abnormal; Notable for the following components:      Result Value   Spec Grav, UA >=1.030 (*)    Blood, UA trace-intact (*)    All other components within normal limits  HIV ANTIBODY (ROUTINE TESTING W REFLEX)  RPR  CERVICOVAGINAL ANCILLARY ONLY    EKG   Radiology No results found.  Procedures Procedures (including critical care time)  Medications Ordered in UC Medications - No data to display  Initial Impression / Assessment and Plan / UC Course  I have reviewed the triage vital signs and the nursing notes.  Pertinent labs & imaging results that were available during my care of the patient were reviewed by me and considered in my medical decision making (see chart for details).   Patient is well-appearing, normotensive, afebrile, not tachycardic, not tachypneic, oxygenating well on room air.    1. Vaginal discharge Self swab is pending for  yeast infection, BV, trichomonas, gonorrhea, chlamydia HIV and syphilis  testing also pending Pelvic exam performed-no CMT or adnexal tenderness, minimal white discharge noted to vaginal vault Urinalysis unremarkable today; urine culture deferred given urinary symptoms have resolved Recommended increasing water intake Recommended use of condoms with every sexual encounter  The patient was given the opportunity to ask questions.  All questions answered to their satisfaction.  The patient is in agreement to this plan.    Final Clinical Impressions(s) / UC Diagnoses   Final diagnoses:  Vaginal discharge     Discharge Instructions      We will contact you if any of the testing from today comes back abnormal.  Please use condoms with every sexual encounter to prevent STI.     ED Prescriptions   None    PDMP not reviewed this encounter.   Valentino NoseMartinez, Kemaria Dedic A, NP 08/16/22 1427

## 2022-08-16 NOTE — ED Triage Notes (Addendum)
Pt reports she has white discharge, abdominal pain, and frequent urination x 4 days. 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  Pt reports she had a new sexual partner x 2 weeks and wants STD testing.

## 2022-08-17 LAB — RPR: RPR Ser Ql: NONREACTIVE

## 2022-08-17 LAB — CERVICOVAGINAL ANCILLARY ONLY
Bacterial Vaginitis (gardnerella): NEGATIVE
Candida Glabrata: NEGATIVE
Candida Vaginitis: NEGATIVE
Chlamydia: NEGATIVE
Comment: NEGATIVE
Comment: NEGATIVE
Comment: NEGATIVE
Comment: NEGATIVE
Comment: NEGATIVE
Comment: NORMAL
Neisseria Gonorrhea: NEGATIVE
Trichomonas: NEGATIVE

## 2022-08-17 LAB — HIV ANTIBODY (ROUTINE TESTING W REFLEX): HIV Screen 4th Generation wRfx: NONREACTIVE

## 2022-12-10 ENCOUNTER — Ambulatory Visit
Admission: RE | Admit: 2022-12-10 | Discharge: 2022-12-10 | Disposition: A | Discharge status: Home / Self Care | Payer: BLUE CROSS/BLUE SHIELD | Source: Ambulatory Visit | Attending: Family Medicine | Admitting: Family Medicine

## 2022-12-10 VITALS — BP 106/71 | HR 63 | Temp 98.6°F | Resp 18

## 2022-12-10 DIAGNOSIS — Z113 Encounter for screening for infections with a predominantly sexual mode of transmission: Secondary | ICD-10-CM | POA: Diagnosis not present

## 2022-12-10 DIAGNOSIS — N760 Acute vaginitis: Secondary | ICD-10-CM | POA: Insufficient documentation

## 2022-12-10 LAB — POCT URINALYSIS DIP (MANUAL ENTRY)
Bilirubin, UA: NEGATIVE
Blood, UA: NEGATIVE
Glucose, UA: NEGATIVE mg/dL
Ketones, POC UA: NEGATIVE mg/dL
Nitrite, UA: NEGATIVE
Protein Ur, POC: NEGATIVE mg/dL
Spec Grav, UA: <=1.005 — AB (ref 1.010–1.025)
Urobilinogen, UA: 0.2 E.U./dL
pH, UA: 5.5 (ref 5.0–8.0)

## 2022-12-10 LAB — POCT URINE PREGNANCY: Preg Test, Ur: NEGATIVE

## 2022-12-10 MED ORDER — FLUCONAZOLE 150 MG PO TABS: 150.0000 mg | ORAL_TABLET | ORAL | 0 refills | Status: DC

## 2022-12-10 NOTE — ED Triage Notes (Signed)
White vaginal discharge, itching, lower abdominal cramping, bladder fullness that started 3 days ago. Pt would like to be screened for STD and have blood work drawn if possible.

## 2022-12-10 NOTE — ED Provider Notes (Signed)
[facility]    CSN: [id number] Arrival date & time: 12/10/22  1656      History   Chief Complaint Chief Complaint  Patient presents with   Vaginal Discharge    Might have a yeast infection thick white mucus coming from vagina with mild itching. Would like to test for STI's as well - Entered by patient    HPI Peggy Ford is a 27 y.o. female.   Patient presenting today with 3-day history of white vaginal discharge, itching, lower abdominal cramping.  Denies rashes, lesions, dysuria, hematuria, vaginal bleeding, nausea vomiting diarrhea, fevers.  So far not try anything over-the-counter for symptoms.  States it feels a bit like when she had a yeast infection in the past.  Requesting STD screening including blood work.  LMP 11/26/2022.      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      Home Medications    Prior to Admission medications   Medication Sig Start Date End Date Taking? Authorizing Provider  fluconazole (DIFLUCAN) 150 MG tablet Take 1 tablet (150 mg total) by mouth every other day. 12/10/22  Yes Particia Nearing, PA-C  hydrOXYzine (ATARAX/VISTARIL) 50 MG tablet Take 0.5-1 tablets (25-50 mg total) by mouth every 8 (eight) hours as needed for anxiety. 12/28/20  Yes Gottschalk, Ashly M, DO  FLUoxetine (PROZAC) 40 MG capsule Take 1 capsule (40 mg total) by mouth daily. 04/09/22   Junie Spencer, FNP  levonorgestrel (MIRENA) 20 MCG/DAY IUD 1 each by Intrauterine route once.    [provider]    Family History Family History  Problem Relation Age of Onset   Heart disease Other        great gm   Diabetes Other        great gf   Colon cancer Other        great gm    Irritable bowel syndrome Maternal Grandmother    Diverticulosis Paternal Grandfather     Social History Social History   Tobacco Use   Smoking status: Never   Smokeless tobacco: Never  Vaping Use   Vaping status: Never Used  Substance Use Topics   Alcohol use: No    Alcohol/week: 0.0 standard drinks of alcohol   Drug use: No     Allergies   Patient has no known allergies.   Review of Systems Review of Systems Per HPI  Physical Exam Triage Vital Signs ED Triage Vitals  Encounter Vitals Group     BP 12/10/22 1731 106/71     Systolic BP Percentile --      Diastolic BP Percentile --      Pulse Rate 12/10/22 1731 63     Resp 12/10/22 1731 18     Temp 12/10/22 1731 98.6 F (37 C)     Temp Source 12/10/22 1731 Oral     SpO2 12/10/22 1731 96 %     Weight --      Height --      Head Circumference --      Peak Flow --      Pain Score 12/10/22 1732 1     Pain Loc --  Pain Education --      Exclude from Growth Chart --    No data found.  Updated Vital Signs BP 106/71 (BP Location: Right Arm)   Pulse 63   Temp 98.6 F (37 C) (Oral)   Resp 18   LMP 11/26/2022 (Approximate)   SpO2 96%   Visual Acuity Right Eye Distance:   Left Eye Distance:   Bilateral Distance:    Right Eye Near:   Left Eye Near:    Bilateral Near:     Physical Exam Vitals and nursing note reviewed.  Constitutional:      Appearance: Normal appearance. She is not ill-appearing.  HENT:     Head: Atraumatic.  Eyes:     Extraocular Movements: Extraocular movements intact.     Conjunctiva/sclera: Conjunctivae normal.  Cardiovascular:     Rate and Rhythm: Normal rate and regular rhythm.     Heart sounds: Normal heart sounds.  Pulmonary:     Effort: Pulmonary effort is normal.     Breath sounds: Normal breath sounds.  Genitourinary:    Comments: GU exam deferred, self swab performed Musculoskeletal:        General: Normal range of motion.     Cervical back: Normal range of motion  and neck supple.  Skin:    General: Skin is warm and dry.  Neurological:     Mental Status: She is alert and oriented to person, place, and time.  Psychiatric:        Mood and Affect: Mood normal.        Thought Content: Thought content normal.        Judgment: Judgment normal.      UC Treatments / Results  Labs (all labs ordered are listed, but only abnormal results are displayed) Labs Reviewed  POCT URINALYSIS DIP (MANUAL ENTRY) - Abnormal; Notable for the following components:      Result Value   Spec Grav, UA <=1.005 (*)    Leukocytes, UA Trace (*)    All other components within normal limits  RPR  HIV ANTIBODY (ROUTINE TESTING W REFLEX)  POCT URINE PREGNANCY  CERVICOVAGINAL ANCILLARY ONLY    EKG   Radiology No results found.  Procedures Procedures (including critical care time)  Medications Ordered in UC Medications - No data to display  Initial Impression / Assessment and Plan / UC Course  I have reviewed the triage vital signs and the nursing notes.  Pertinent labs & imaging results that were available during my care of the patient were reviewed by me and considered in my medical decision making (see chart for details).     Vital signs and exam overall reassuring today, urinalysis benign, urine Preg negative, STD screening panel pending.  Treat for yeast infection with fluconazole while awaiting results and adjust if needed.  Discussed supportive over-the-counter measures as well.  Return for worsening symptoms.  Final Clinical Impressions(s) / UC Diagnoses   Final diagnoses:  Screening examination for STI  Acute vaginitis   Discharge Instructions   None    ED Prescriptions     Medication Sig Dispense Auth. Provider   fluconazole (DIFLUCAN) 150 MG tablet Take 1 tablet (150 mg total) by mouth every other day. 3 tablet Particia Nearing, [place]      PDMP not reviewed this encounter.   Particia Nearing, [place] 12/10/22 [phone]

## 2022-12-12 ENCOUNTER — Telehealth: Payer: Self-pay | Admitting: Emergency Medicine

## 2022-12-12 MED ORDER — METRONIDAZOLE 500 MG PO TABS: 500.0000 mg | ORAL_TABLET | Freq: Two times a day (BID) | ORAL | 0 refills | Status: DC

## 2023-01-29 ENCOUNTER — Ambulatory Visit
Admission: RE | Admit: 2023-01-29 | Discharge: 2023-01-29 | Disposition: A | Discharge status: Home / Self Care | Payer: BLUE CROSS/BLUE SHIELD | Source: Ambulatory Visit | Attending: Nurse Practitioner | Admitting: Nurse Practitioner

## 2023-01-29 VITALS — BP 104/72 | HR 64 | Temp 97.8°F | Resp 16

## 2023-01-29 DIAGNOSIS — R399 Unspecified symptoms and signs involving the genitourinary system: Secondary | ICD-10-CM | POA: Insufficient documentation

## 2023-01-29 DIAGNOSIS — R300 Dysuria: Secondary | ICD-10-CM | POA: Diagnosis present

## 2023-01-29 LAB — POCT URINALYSIS DIP (MANUAL ENTRY)
Bilirubin, UA: NEGATIVE
Glucose, UA: NEGATIVE mg/dL
Ketones, POC UA: NEGATIVE mg/dL
Nitrite, UA: NEGATIVE
Protein Ur, POC: NEGATIVE mg/dL
Spec Grav, UA: 1.01 (ref 1.010–1.025)
Urobilinogen, UA: 0.2 E.U./dL
pH, UA: 6.5 (ref 5.0–8.0)

## 2023-01-29 MED ORDER — NITROFURANTOIN MONOHYD MACRO 100 MG PO CAPS: 100.0000 mg | ORAL_CAPSULE | Freq: Two times a day (BID) | ORAL | 0 refills | Status: DC

## 2023-01-29 NOTE — ED Triage Notes (Addendum)
Pt c/o UTI symptoms urinary frequency, urinary urgency , burning with urination, abdominal pressure once done. Pt has also requested STD testing due to  symptoms, denies exposure.

## 2023-01-29 NOTE — Discharge Instructions (Signed)
Urine culture is pending.  You will be contacted if the results of the culture are negative and advised to stop the antibiotic.  You may also be contacted if the result of the culture is positive and the medication needs to be changed.  You will also have access to results via MyChart. Take medication as prescribed. Continue to drink plenty of fluids.  Try to drink at least 8-10 8 ounce glasses of water daily. Avoid caffeine while symptoms persist.  This includes tea, soda, and coffee. Develop a toileting schedule that will allow you to urinate every 2 hours. If your urine culture result is negative and you continue to experience symptoms, it is recommended that you follow-up with your primary care physician for further evaluation. Follow-up as needed.

## 2023-01-29 NOTE — ED Provider Notes (Signed)
[facility]    CSN: [id number] Arrival date & time: 01/29/23  1259      History   Chief Complaint Chief Complaint  Patient presents with   Urinary Frequency    Burning and pain with urination along with frequent bathroom trips - Entered by patient    HPI Peggy Ford is a 27 y.o. female.   The history is provided by the patient.   Patient presents with a 1 day history of urinary frequency, urgency, pain with urination, and lower abdominal pressure.  Denies fever, chills, chest pain, abdominal pain, nausea, vomiting, hematuria, decreased urine stream, flank pain, low back pain, or vaginal symptoms.  Patient is requesting STI testing as well.  She reports that she took Azo for her symptoms with some relief.  Denies history of recurrent UTIs.  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      Home Medications    Prior to Admission medications   Medication Sig Start Date End Date Taking? Authorizing Provider  nitrofurantoin, macrocrystal-monohydrate, (MACROBID) 100 MG capsule Take 1 capsule (100 mg total) by mouth 2 (two) times daily. 01/29/23  Yes Zala Degrasse-Warren, Sadie Haber, NP  fluconazole (DIFLUCAN) 150 MG tablet Take 1 tablet (150 mg total) by mouth every other day. 12/10/22   Particia Nearing, PA-C  FLUoxetine (PROZAC) 40 MG capsule Take 1 capsule (40 mg total) by mouth daily. 04/09/22   Jannifer Rodney A, FNP  hydrOXYzine (ATARAX/VISTARIL) 50 MG tablet Take 0.5-1 tablets (25-50 mg total) by mouth every 8 (eight) hours as needed for anxiety. 12/28/20   Raliegh Ip, DO  levonorgestrel (MIRENA) 20 MCG/DAY IUD 1 each by Intrauterine route once.    [provider]  metroNIDAZOLE  (FLAGYL) 500 MG tablet Take 1 tablet (500 mg total) by mouth 2 (two) times daily. 12/12/22   LampteyBritta Mccreedy, MD    Family History Family History  Problem Relation Age of Onset   Heart disease Other        great gm   Diabetes Other        great gf   Colon cancer Other        great gm   Irritable bowel syndrome Maternal Grandmother    Diverticulosis Paternal Grandfather     Social History Social History   Tobacco Use   Smoking status: Never   Smokeless tobacco: Never  Vaping Use   Vaping status: Never Used  Substance Use Topics   Alcohol use: No    Alcohol/week: 0.0 standard drinks of alcohol   Drug use: No     Allergies   Patient has no known allergies.   Review of Systems Review of Systems Per HPI  Physical Exam Triage Vital Signs ED Triage Vitals  Encounter Vitals Group     BP 01/29/23 1304 104/72     Systolic BP Percentile --      Diastolic BP Percentile --      Pulse Rate 01/29/23 1304 64     Resp 01/29/23 1304 16     Temp 01/29/23 1304 97.8 F (36.6 C)     Temp Source 01/29/23 1304 Oral  SpO2 01/29/23 1304 96 %     Weight --      Height --      Head Circumference --      Peak Flow --      Pain Score 01/29/23 1305 3     Pain Loc --      Pain Education --      Exclude from Growth Chart --    No data found.  Updated Vital Signs BP 104/72 (BP Location: Right Arm)   Pulse 64   Temp 97.8 F (36.6 C) (Oral)   Resp 16   LMP 01/22/2023 (Within Days)   SpO2 96%   Visual Acuity Right Eye Distance:   Left Eye Distance:   Bilateral Distance:    Right Eye Near:   Left Eye Near:    Bilateral Near:     Physical Exam Vitals and nursing note reviewed.  Constitutional:      General: She is not in acute distress.    Appearance: Normal appearance.  HENT:     Head: Normocephalic.  Eyes:     Extraocular Movements: Extraocular movements intact.     Pupils: Pupils are equal, round, and reactive to light.  Cardiovascular:     Rate and Rhythm:  Regular rhythm.     Pulses: Normal pulses.     Heart sounds: Normal heart sounds.  Pulmonary:     Effort: Pulmonary effort is normal.     Breath sounds: Normal breath sounds.  Abdominal:     General: Bowel sounds are normal.     Palpations: Abdomen is soft.     Tenderness: There is no abdominal tenderness. There is no right CVA tenderness or left CVA tenderness.  Musculoskeletal:     Cervical back: Normal range of motion.  Lymphadenopathy:     Cervical: No cervical adenopathy.  Skin:    General: Skin is warm and dry.  Neurological:     General: No focal deficit present.     Mental Status: She is alert and oriented to person, place, and time.  Psychiatric:        Mood and Affect: Mood normal.        Behavior: Behavior normal.      UC Treatments / Results  Labs (all labs ordered are listed, but only abnormal results are displayed) Labs Reviewed  POCT URINALYSIS DIP (MANUAL ENTRY) - Abnormal; Notable for the following components:      Result Value   Clarity, UA cloudy (*)    Blood, UA trace-lysed (*)    Leukocytes, UA Trace (*)    All other components within normal limits  URINE CULTURE  CERVICOVAGINAL ANCILLARY ONLY    EKG   Radiology No results found.  Procedures Procedures (including critical care time)  Medications Ordered in UC Medications - No data to display  Initial Impression / Assessment and Plan / UC Course  I have reviewed the triage vital signs and the nursing notes.  Pertinent labs & imaging results that were available during my care of the patient were reviewed by me and considered in my medical decision making (see chart for details).  The patient is well-appearing, she is in no acute distress, vital signs are stable.  Urinalysis does not indicate an obvious urinary tract infection; however, based on the patient's symptoms, will treat with Macrobid 100 mg while culture is pending.  Cytology swab is also pending.  Supportive care recommendations  were provided and discussed with the patient to include increasing her  fluid intake, over-the-counter analgesics, and avoiding caffeine while symptoms persist.  Patient was advised to follow-up with her PCP if the urine culture is negative and she continues to experience symptoms.  Patient is in agreement with this plan of care and verbalizes understanding.  All questions were answered.  Patient stable for discharge.   Final Clinical Impressions(s) / UC Diagnoses   Final diagnoses:  UTI symptoms     Discharge Instructions      Urine culture is pending.  You will be contacted if the results of the culture are negative and advised to stop the antibiotic.  You may also be contacted if the result of the culture is positive and the medication needs to be changed.  You will also have access to results via MyChart. Take medication as prescribed. Continue to drink plenty of fluids.  Try to drink at least 8-10 8 ounce glasses of water daily. Avoid caffeine while symptoms persist.  This includes tea, soda, and coffee. Develop a toileting schedule that will allow you to urinate every 2 hours. If your urine culture result is negative and you continue to experience symptoms, it is recommended that you follow-up with your primary care physician for further evaluation. Follow-up as needed.     ED Prescriptions     Medication Sig Dispense Auth. Provider   nitrofurantoin, macrocrystal-monohydrate, (MACROBID) 100 MG capsule Take 1 capsule (100 mg total) by mouth 2 (two) times daily. 10 capsule Ephriam Turman-Warren, Sadie Haber, NP      PDMP not reviewed this encounter.   Abran Cantor, NP 01/29/23 1329

## 2023-01-30 LAB — CERVICOVAGINAL ANCILLARY ONLY
Chlamydia: NEGATIVE
Comment: NEGATIVE
Comment: NEGATIVE
Comment: NORMAL
Neisseria Gonorrhea: NEGATIVE
Trichomonas: NEGATIVE

## 2023-01-31 LAB — URINE CULTURE
Culture: >=100000
Special Requests: NORMAL

## 2023-02-01 LAB — URINE CULTURE

## 2023-03-19 ENCOUNTER — Ambulatory Visit
Admission: RE | Admit: 2023-03-19 | Discharge: 2023-03-19 | Disposition: A | Discharge status: Home / Self Care | Payer: BLUE CROSS/BLUE SHIELD | Source: Ambulatory Visit | Attending: Nurse Practitioner | Admitting: Nurse Practitioner

## 2023-03-19 ENCOUNTER — Other Ambulatory Visit: Payer: Self-pay

## 2023-03-19 VITALS — Resp 20

## 2023-03-19 DIAGNOSIS — N390 Urinary tract infection, site not specified: Secondary | ICD-10-CM | POA: Insufficient documentation

## 2023-03-19 DIAGNOSIS — N898 Other specified noninflammatory disorders of vagina: Secondary | ICD-10-CM | POA: Insufficient documentation

## 2023-03-19 LAB — POCT URINALYSIS DIP (MANUAL ENTRY)
Bilirubin, UA: NEGATIVE
Glucose, UA: NEGATIVE mg/dL
Ketones, POC UA: NEGATIVE mg/dL
Nitrite, UA: POSITIVE — AB
Protein Ur, POC: NEGATIVE mg/dL
Spec Grav, UA: <=1.005 — AB (ref 1.010–1.025)
Urobilinogen, UA: 0.2 U/dL
pH, UA: 6 (ref 5.0–8.0)

## 2023-03-19 MED ORDER — CEPHALEXIN 500 MG PO CAPS: 500.0000 mg | ORAL_CAPSULE | Freq: Two times a day (BID) | ORAL | 0 refills | Status: DC

## 2023-03-19 NOTE — ED Triage Notes (Addendum)
Pt reports dysuria, bladder spasms, urinary frequency x2 days. Reports has been taking AZO. Denies any known fevers, nausea.   Pt also inquiring about possible BV. Reports hx of similar.  Pt educated on cleaning and obtaining swab prior to providing urinalysis. Provider aware.

## 2023-03-19 NOTE — ED Provider Notes (Signed)
[facility]    CSN: [id number] Arrival date & time: 03/19/23  1248      History   Chief Complaint Chief Complaint  Patient presents with   Urinary Frequency    Suspected UTI. Urinary frequency, pain while peeing, feel the urge to pee after peeing - Entered by patient    HPI Peggy Ford is a 27 y.o. female.   Patient presenting today with 2-day history of urinary frequency, dysuria, bladder spasms.  Denies fever, chills, flank pain, nausea, vomiting.  Also having vaginal discharge and irritation, states it feels similar to when she has had BV in the past.  Not trying anything for the symptoms but is taking Azo as needed for the urinary symptoms.  Currently on her menstrual cycle.    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      Home Medications    Prior to Admission medications   Medication Sig Start Date End Date Taking? Authorizing Provider  cephALEXin (KEFLEX) 500 MG capsule Take 1 capsule (500 mg total) by mouth 2 (two) times daily. 03/19/23  Yes Particia Nearing, PA-C  fluconazole (DIFLUCAN) 150 MG tablet Take 1 tablet (150 mg total) by mouth every other day. 12/10/22   Particia Nearing, PA-C  FLUoxetine (PROZAC) 40 MG capsule Take 1 capsule (40 mg total) by mouth daily. 04/09/22   Jannifer Rodney A, FNP  hydrOXYzine (ATARAX/VISTARIL) 50 MG tablet Take 0.5-1 tablets (25-50 mg total) by mouth every 8 (eight) hours as needed for anxiety. 12/28/20   Raliegh Ip, DO  levonorgestrel (MIRENA) 20 MCG/DAY IUD 1 each by Intrauterine route once.    [provider]  metroNIDAZOLE (FLAGYL) 500 MG tablet Take 1 tablet (500 mg total) by mouth 2 (two) times daily. 12/12/22    LampteyBritta Mccreedy, MD  nitrofurantoin, macrocrystal-monohydrate, (MACROBID) 100 MG capsule Take 1 capsule (100 mg total) by mouth 2 (two) times daily. 01/29/23   Leath-Warren, Sadie Haber, NP    Family History Family History  Problem Relation Age of Onset   Heart disease Other        great gm   Diabetes Other        great gf   Colon cancer Other        great gm   Irritable bowel syndrome Maternal Grandmother    Diverticulosis Paternal Grandfather     Social History Social History   Tobacco Use   Smoking status: Never   Smokeless tobacco: Never  Vaping Use   Vaping status: Never Used  Substance Use Topics   Alcohol use: No    Alcohol/week: 0.0 standard drinks of alcohol   Drug use: No    Allergies   Patient has no known allergies.  Review of Systems Review of Systems PER HPI  Physical Exam Triage Vital Signs ED Triage Vitals [03/19/23 1300]  Encounter Vitals Group     BP      Systolic BP Percentile      Diastolic BP Percentile      Pulse      Resp      Temp      Temp src  SpO2      Weight      Height      Head Circumference      Peak Flow      Pain Score 4     Pain Loc      Pain Education      Exclude from Growth Chart    No data found.  Updated Vital Signs Resp 20 Comment: pt went to bathroom at end of triage questions being answered. did not obtain vitals prior to pt being discharged. NAD noted. alert and oriented. steady gait. provider aware.  LMP 03/18/2023 (Approximate)   Visual Acuity Right Eye Distance:   Left Eye Distance:   Bilateral Distance:    Right Eye Near:   Left Eye Near:    Bilateral Near:     Physical Exam Vitals and nursing note reviewed.  Constitutional:      Appearance: Normal appearance. She is not ill-appearing.  HENT:     Head: Atraumatic.  Eyes:     Extraocular Movements: Extraocular movements intact.     Conjunctiva/sclera: Conjunctivae normal.  Cardiovascular:     Rate and Rhythm: Normal rate and regular  rhythm.     Heart sounds: Normal heart sounds.  Pulmonary:     Effort: Pulmonary effort is normal.     Breath sounds: Normal breath sounds.  Abdominal:     General: Bowel sounds are normal. There is no distension.     Palpations: Abdomen is soft.     Tenderness: There is no abdominal tenderness. There is no right CVA tenderness, left CVA tenderness or guarding.  Genitourinary:    Comments: GU exam deferred, self swab performed Musculoskeletal:        General: Normal range of motion.     Cervical back: Normal range of motion and neck supple.  Skin:    General: Skin is warm and dry.  Neurological:     Mental Status: She is alert and oriented to person, place, and time.  Psychiatric:        Mood and Affect: Mood normal.        Thought Content: Thought content normal.        Judgment: Judgment normal.      UC Treatments / Results  Labs (all labs ordered are listed, but only abnormal results are displayed) Labs Reviewed  POCT URINALYSIS DIP (MANUAL ENTRY) - Abnormal; Notable for the following components:      Result Value   Spec Grav, UA <=1.005 (*)    Blood, UA moderate (*)    Nitrite, UA Positive (*)    Leukocytes, UA Large (3+) (*)    All other components within normal limits  URINE CULTURE  CERVICOVAGINAL ANCILLARY ONLY    EKG   Radiology No results found.  Procedures Procedures (including critical care time)  Medications Ordered in UC Medications - No data to display  Initial Impression / Assessment and Plan / UC Course  I have reviewed the triage vital signs and the nursing notes.  Pertinent labs & imaging results that were available during my care of the patient were reviewed by me and considered in my medical decision making (see chart for details).     Unfortunately vital signs were not recorded during visit but patient very well appearing today. Treat with Keflex for suspected urinary tract infection, await urine culture and adjust if needed. Vaginal  swab pending, treat based on results.   Final Clinical Impressions(s) / UC Diagnoses   Final diagnoses:  Vaginal discharge  Acute lower UTI   Discharge Instructions   None    ED Prescriptions     Medication Sig Dispense Auth. Provider   cephALEXin (KEFLEX) 500 MG capsule Take 1 capsule (500 mg total) by mouth 2 (two) times daily. 10 capsule Particia Nearing, [place]      PDMP not reviewed this encounter.   Particia Nearing, [place] 03/19/23 1347

## 2023-03-20 ENCOUNTER — Telehealth: Payer: Self-pay

## 2023-03-20 LAB — CERVICOVAGINAL ANCILLARY ONLY
Bacterial Vaginitis (gardnerella): NEGATIVE
Candida Glabrata: NEGATIVE
Candida Vaginitis: POSITIVE — AB
Chlamydia: NEGATIVE
Comment: NEGATIVE
Comment: NEGATIVE
Comment: NEGATIVE
Comment: NEGATIVE
Comment: NEGATIVE
Comment: NORMAL
Neisseria Gonorrhea: NEGATIVE
Trichomonas: NEGATIVE

## 2023-03-20 MED ORDER — FLUCONAZOLE 150 MG PO TABS: 150.0000 mg | ORAL_TABLET | Freq: Once | ORAL | 0 refills | Status: AC

## 2023-03-20 NOTE — Telephone Encounter (Signed)
Per protocol, pt requires tx with Diflucan. Attempted to reach patient x1. LVM. Rx sent to pharmacy on file.

## 2023-03-22 LAB — URINE CULTURE: Culture: 20000 — AB

## 2023-05-13 ENCOUNTER — Ambulatory Visit: Payer: BLUE CROSS/BLUE SHIELD | Admitting: Family Medicine

## 2023-05-13 ENCOUNTER — Encounter: Payer: Self-pay | Admitting: Nurse Practitioner

## 2023-05-13 ENCOUNTER — Ambulatory Visit (INDEPENDENT_AMBULATORY_CARE_PROVIDER_SITE_OTHER): Payer: BLUE CROSS/BLUE SHIELD | Admitting: Nurse Practitioner

## 2023-05-13 VITALS — BP 112/67 | Temp 97.9°F | Ht 67.0 in | Wt 252.2 lb

## 2023-05-13 DIAGNOSIS — R6889 Other general symptoms and signs: Secondary | ICD-10-CM | POA: Diagnosis not present

## 2023-05-13 DIAGNOSIS — J069 Acute upper respiratory infection, unspecified: Secondary | ICD-10-CM | POA: Diagnosis not present

## 2023-05-13 DIAGNOSIS — R300 Dysuria: Secondary | ICD-10-CM | POA: Diagnosis not present

## 2023-05-13 LAB — URINALYSIS, COMPLETE
Bilirubin, UA: NEGATIVE
Ketones, UA: NEGATIVE
Leukocytes,UA: NEGATIVE
Nitrite, UA: NEGATIVE
Protein,UA: NEGATIVE
Specific Gravity, UA: 1.015 (ref 1.005–1.030)
Urobilinogen, Ur: 0.2 mg/dL (ref 0.2–1.0)
pH, UA: 6 (ref 5.0–7.5)

## 2023-05-13 LAB — MICROSCOPIC EXAMINATION
Bacteria, UA: NONE SEEN
Renal Epithel, UA: NONE SEEN /[HPF]
Yeast, UA: NONE SEEN

## 2023-05-13 LAB — VERITOR FLU A/B WAIVED
Influenza A: NEGATIVE
Influenza B: NEGATIVE

## 2023-05-13 MED ORDER — AMOXICILLIN-POT CLAVULANATE 875-125 MG PO TABS
1.0000 | ORAL_TABLET | Freq: Two times a day (BID) | ORAL | 0 refills | Status: DC
Start: 2023-05-13 — End: 2023-06-03

## 2023-05-13 MED ORDER — PREDNISONE 20 MG PO TABS: 40.0000 mg | ORAL_TABLET | Freq: Every day | ORAL | 0 refills | Status: AC

## 2023-05-13 NOTE — Progress Notes (Signed)
 Subjective:    Patient ID: Peggy Ford, female    DOB: 1996/01/14, 28 y.o.   MRN: [id number]   Chief Complaint: URI and UTI  URI  This is a new problem. The current episode started 1 to 4 weeks ago. The problem has been waxing and waning. The maximum temperature recorded prior to her arrival was 100.4 - 100.9 F. The fever has been present for 3 to 4 days. Associated symptoms include congestion, coughing, ear pain, headaches and rhinorrhea. Pertinent negatives include no sore throat. She has tried decongestant for the symptoms. The treatment provided mild relief.  Urinary Tract Infection  This is a new problem. The current episode started in the past 7 days. The problem occurs every urination. The problem has been waxing and waning. The quality of the pain is described as burning. The pain is at a severity of 5/10. The pain is moderate. The maximum temperature recorded prior to her arrival was 100 - 100.9 F. The fever has been present for 1 - 2 days. She is Not sexually active. There is No history of pyelonephritis. Associated symptoms include chills, frequency, hesitancy and urgency. Pertinent negatives include no discharge. She has tried nothing for the symptoms. The treatment provided no relief.     Patient Active Problem List   Diagnosis Date Noted   Mixed anxiety and depressive disorder 01/26/2016       Review of Systems  Constitutional:  Positive for chills and fever.  HENT:  Positive for congestion, ear pain and rhinorrhea. Negative for sore throat.   Respiratory:  Positive for cough.   Genitourinary:  Positive for frequency, hesitancy and urgency.  Neurological:  Positive for headaches.       Objective:   Physical Exam Constitutional:      Appearance: Normal appearance.  HENT:     Right Ear: A middle ear effusion is present.     Nose: Congestion and rhinorrhea present.     Mouth/Throat:     Mouth: Mucous membranes are moist.  Eyes:     Extraocular Movements:  Extraocular movements intact.     Pupils: Pupils are equal, round, and reactive to light.  Cardiovascular:     Rate and Rhythm: Normal rate and regular rhythm.     Heart sounds: Normal heart sounds.  Pulmonary:     Effort: Pulmonary effort is normal.     Breath sounds: Normal breath sounds. No wheezing.     Comments: Deep cough Musculoskeletal:     Cervical back: Normal range of motion and neck supple.  Skin:    General: Skin is warm.  Neurological:     General: No focal deficit present.     Mental Status: She is alert and oriented to person, place, and time.  Psychiatric:        Mood and Affect: Mood normal.        Behavior: Behavior normal.    BP 112/67   Temp 97.9 F (36.6 C) (Oral)   Ht 5' 7 (1.702 m)   Wt 252 lb 4 oz (114.4 kg)   SpO2 96%   BMI 39.51 kg/m    Flu negative Urine clear      Assessment & Plan:  Peggy Ford in today with chief complaint of No chief complaint on file.   1. Dysuria (Primary) - Urinalysis, Complete  2. Flu-like symptoms - Veritor Flu A/B Waived  3. URI with cough and congestion 1. Take meds as prescribed 2. Use a cool mist  humidifier especially during the winter months and when heat has been humid. 3. Use saline nose sprays frequently 4. Saline irrigations of the nose can be very helpful if done frequently.  * 4X daily for 1 week*  * Use of a nettie pot can be helpful with this. Follow directions with this* 5. Drink plenty of fluids 6. Keep thermostat turn down low 7.For any cough or congestion- mucinex OTC 8. For fever or aces or pains- take tylenol or ibuprofen appropriate for age and weight.  * for fevers greater than 101 orally you may alternate ibuprofen and tylenol every  3 hours.    Meds ordered this encounter  Medications   amoxicillin -clavulanate (AUGMENTIN ) 875-125 MG tablet    Sig: Take 1 tablet by mouth 2 (two) times daily.    Dispense:  14 tablet    Refill:  0    Supervising Provider:   DETTINGER,  JOSHUA A [[id number]]   predniSONE  (DELTASONE ) 20 MG tablet    Sig: Take 2 tablets (40 mg total) by mouth daily with breakfast for 5 days. 2 po daily for 5 days    Dispense:  10 tablet    Refill:  0    Supervising Provider:   MARYANNE CHEW A [[id number]]     The above assessment and management plan was discussed with the patient. The patient verbalized understanding of and has agreed to the management plan. Patient is aware to call the clinic if symptoms persist or worsen. Patient is aware when to return to the clinic for a follow-up visit. Patient educated on when it is appropriate to go to the emergency department.   Mary-Margaret Gladis, FNP

## 2023-05-13 NOTE — Patient Instructions (Signed)
 1. Take meds as prescribed 2. Use a cool mist humidifier especially during the winter months and when heat has been humid. 3. Use saline nose sprays frequently 4. Saline irrigations of the nose can be very helpful if done frequently.  * 4X daily for 1 week*  * Use of a nettie pot can be helpful with this. Follow directions with this* 5. Drink plenty of fluids 6. Keep thermostat turn down low 7.For any cough or congestion- mucinex OTC 8. For fever or aces or pains- take tylenol or ibuprofen appropriate for age and weight.  * for fevers greater than 101 orally you may alternate ibuprofen and tylenol every  3 hours.

## 2023-06-03 ENCOUNTER — Ambulatory Visit
Admission: EM | Admit: 2023-06-03 | Discharge: 2023-06-03 | Disposition: A | Discharge status: Home / Self Care | Payer: BLUE CROSS/BLUE SHIELD | Attending: Nurse Practitioner | Admitting: Nurse Practitioner

## 2023-06-03 DIAGNOSIS — N898 Other specified noninflammatory disorders of vagina: Secondary | ICD-10-CM | POA: Insufficient documentation

## 2023-06-03 MED ORDER — FLUCONAZOLE 150 MG PO TABS: 150.0000 mg | ORAL_TABLET | Freq: Once | ORAL | 0 refills | Status: AC

## 2023-06-03 NOTE — ED Provider Notes (Signed)
[facility]    CSN: [id number] Arrival date & time: 06/03/23  1527      History   Chief Complaint Chief Complaint  Patient presents with   Vaginal Discharge    HPI Peggy Ford is a 28 y.o. female.   Patient presents today for 4 day history of white, thick, clumpy vaginal discharge.  She also endorses vaginal itching.  No rashes, sores, or bumps on her genitalia.  No burning with urination, increased urinary frequency, or urgency.  No abdominal pain, nausea/vomiting, diarrhea, groin swelling, or fever.  She completed Augmentin a few weeks ago for an upper respiratory infection.  Has been taking probiotics without much benefit.  Does have history of yeast infections.  Patient is sexually active, no known exposures to STI, is willing to have STI testing today.      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      Home Medications    Prior to Admission medications   Medication Sig Start Date End Date Taking? Authorizing Provider  fluconazole (DIFLUCAN) 150 MG tablet Take 1 tablet (150 mg total) by mouth once for 1 dose. Take 1 dose today, repeat in 72 hours if symptoms persist. 06/03/23 06/03/23 Yes Valentino Nose, NP  FLUoxetine (PROZAC) 40 MG capsule Take 1 capsule (40 mg total) by mouth daily. Patient not taking: Reported on 06/03/2023 04/09/22   Jannifer Rodney A, FNP  hydrOXYzine (ATARAX/VISTARIL) 50 MG tablet Take 0.5-1 tablets (25-50 mg total) by mouth every 8 (eight) hours as needed for anxiety. Patient not taking: Reported on 06/03/2023 12/28/20   Raliegh Ip, DO  levonorgestrel (MIRENA) 20 MCG/DAY IUD 1 each by Intrauterine route once.    [provider]    Family  History Family History  Problem Relation Age of Onset   Heart disease Other        great gm   Diabetes Other        great gf   Colon cancer Other        great gm   Irritable bowel syndrome Maternal Grandmother    Diverticulosis Paternal Grandfather     Social History Social History   Tobacco Use   Smoking status: Never   Smokeless tobacco: Never  Vaping Use   Vaping status: Never Used  Substance Use Topics   Alcohol use: No    Alcohol/week: 0.0 standard drinks of alcohol   Drug use: No     Allergies   Patient has no known allergies.   Review of Systems Review of Systems Per HPI  Physical Exam Triage Vital Signs ED Triage Vitals  Encounter Vitals Group     BP 06/03/23 1601 103/71     Systolic BP Percentile --      Diastolic BP Percentile --      Pulse Rate 06/03/23 1601 73     Resp 06/03/23 1601 16     Temp 06/03/23 1601 98.8 F (37.1 C)     Temp Source 06/03/23 1601 Oral     SpO2 06/03/23 1601 98 %     Weight --      Height --  Head Circumference --      Peak Flow --      Pain Score 06/03/23 1605 0     Pain Loc --      Pain Education --      Exclude from Growth Chart --    No data found.  Updated Vital Signs BP 103/71 (BP Location: Right Arm)   Pulse 73   Temp 98.8 F (37.1 C) (Oral)   Resp 16   LMP 05/14/2023 (Approximate)   SpO2 98%   Visual Acuity Right Eye Distance:   Left Eye Distance:   Bilateral Distance:    Right Eye Near:   Left Eye Near:    Bilateral Near:     Physical Exam Vitals and nursing note reviewed.  Constitutional:      General: She is not in acute distress.    Appearance: Normal appearance. She is not toxic-appearing.  Pulmonary:     Effort: Pulmonary effort is normal. No respiratory distress.  Genitourinary:    Comments: Deferred - self swab performed by patient Skin:    General: Skin is warm and dry.     Coloration: Skin is not jaundiced or pale.     Findings: No erythema.  Neurological:     Mental  Status: She is alert and oriented to person, place, and time.     Motor: No weakness.     Gait: Gait normal.  Psychiatric:        Mood and Affect: Mood normal.        Behavior: Behavior is cooperative.      UC Treatments / Results  Labs (all labs ordered are listed, but only abnormal results are displayed) Labs Reviewed  CERVICOVAGINAL ANCILLARY ONLY    EKG   Radiology No results found.  Procedures Procedures (including critical care time)  Medications Ordered in UC Medications - No data to display  Initial Impression / Assessment and Plan / UC Course  I have reviewed the triage vital signs and the nursing notes.  Pertinent labs & imaging results that were available during my care of the patient were reviewed by me and considered in my medical decision making (see chart for details).   Patient is well-appearing, normotensive, afebrile, not tachycardic, not tachypneic, oxygenating well on room air.   1. Vaginal itching 2. Vaginal discharge Symptoms are consistent for yeast infection Will treat with fluconazole today, repeat in 72 hours if symptoms persist Vaginal cytology is pending - treat as indicated if anything other than yeast infection Safe sex practices discussed   The patient was given the opportunity to ask questions.  All questions answered to their satisfaction.  The patient is in agreement to this plan.   Final Clinical Impressions(s) / UC Diagnoses   Final diagnoses:  Vaginal itching  Vaginal discharge     Discharge Instructions      Take the fluconazole as prescribed to treat yeast infection.  We will contact you later this week if we need to treat for anything else.      ED Prescriptions     Medication Sig Dispense Auth. Provider   fluconazole (DIFLUCAN) 150 MG tablet Take 1 tablet (150 mg total) by mouth once for 1 dose. Take 1 dose today, repeat in 72 hours if symptoms persist. 2 tablet Valentino Nose, NP      PDMP not reviewed  this encounter.   Valentino Nose, NP 06/03/23 [phone]

## 2023-06-03 NOTE — Discharge Instructions (Signed)
Take the fluconazole as prescribed to treat yeast infection.  We will contact you later this week if we need to treat for anything else.

## 2023-06-03 NOTE — ED Triage Notes (Signed)
Pt states she is having white vaginal discharge, vaginal itching x 3 days.

## 2023-06-04 ENCOUNTER — Ambulatory Visit: Payer: BLUE CROSS/BLUE SHIELD

## 2023-06-04 LAB — CERVICOVAGINAL ANCILLARY ONLY
Candida Glabrata: NEGATIVE
Candida Vaginitis: POSITIVE — AB
Chlamydia: NEGATIVE
Comment: NEGATIVE
Comment: NEGATIVE
Comment: NEGATIVE
Comment: NEGATIVE
Comment: NORMAL
Neisseria Gonorrhea: NEGATIVE
Trichomonas: NEGATIVE

## 2023-08-22 ENCOUNTER — Ambulatory Visit

## 2023-08-27 ENCOUNTER — Ambulatory Visit
Admission: RE | Admit: 2023-08-27 | Discharge: 2023-08-27 | Disposition: A | Discharge status: Home / Self Care | Source: Ambulatory Visit | Attending: Family Medicine | Admitting: Family Medicine

## 2023-08-27 VITALS — BP 132/74 | HR 69 | Temp 97.9°F | Resp 18

## 2023-08-27 DIAGNOSIS — N760 Acute vaginitis: Secondary | ICD-10-CM | POA: Diagnosis present

## 2023-08-27 LAB — POCT URINALYSIS DIP (MANUAL ENTRY)
Bilirubin, UA: NEGATIVE
Blood, UA: NEGATIVE
Glucose, UA: NEGATIVE mg/dL
Ketones, POC UA: NEGATIVE mg/dL
Leukocytes, UA: NEGATIVE
Nitrite, UA: NEGATIVE
Protein Ur, POC: NEGATIVE mg/dL
Spec Grav, UA: 1.02 (ref 1.010–1.025)
Urobilinogen, UA: 0.2 U/dL
pH, UA: 5.5 (ref 5.0–8.0)

## 2023-08-27 LAB — POCT URINE PREGNANCY: Preg Test, Ur: NEGATIVE

## 2023-08-27 NOTE — ED Triage Notes (Signed)
 Yellow discharge and lower ABD cramping x 1 week.

## 2023-08-27 NOTE — ED Provider Notes (Signed)
 [facility]    CSN: [id number] Arrival date & time: 08/27/23  1423      History   Chief Complaint Chief Complaint  Patient presents with   Vaginal Discharge    Cramping and yellowish discharge. - Entered by patient    HPI Peggy Ford is a 28 y.o. female.   Patient presenting today with about 1 week history of suprapubic cramping, yellow vaginal discharge, mild itching and irritation.  Denies rashes or lesions, fever, chills, nausea, vomiting, flank pain, bowel changes.  So far not trying anything over-the-counter for symptoms.  States she is sexually active, no known exposures to STIs but does want to be screened.  Per chart review has IUD in place.    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      Home Medications    Prior to Admission medications   Medication Sig Start Date End Date Taking? Authorizing Provider  levonorgestrel  (MIRENA ) 20 MCG/DAY IUD 1 each by Intrauterine route once.    [provider]    Family History Family History  Problem Relation Age of Onset   Heart disease Other        great gm   Diabetes Other        great gf   Colon cancer Other        great gm   Irritable bowel syndrome Maternal Grandmother    Diverticulosis Paternal Grandfather     Social History Social History   Tobacco Use   Smoking status: Never   Smokeless tobacco: Never  Vaping Use   Vaping status: Never Used  Substance Use Topics   Alcohol use: No    Alcohol/week: 0.0 standard drinks of alcohol   Drug use: No     Allergies   Patient has no known allergies.   Review of Systems Review of Systems Per HPI  Physical Exam Triage Vital Signs ED Triage Vitals   Encounter Vitals Group     BP 08/27/23 1429 132/74     Systolic BP Percentile --      Diastolic BP Percentile --      Pulse Rate 08/27/23 1429 69     Resp 08/27/23 1429 18     Temp 08/27/23 1429 97.9 F (36.6 C)     Temp Source 08/27/23 1429 Oral     SpO2 08/27/23 1429 98 %     Weight --      Height --      Head Circumference --      Peak Flow --      Pain Score 08/27/23 1430 4     Pain Loc --      Pain Education --      Exclude from Growth Chart --    No data found.  Updated Vital Signs BP 132/74 (BP Location: Right Arm)   Pulse 69   Temp 97.9 F (36.6 C) (Oral)   Resp 18   SpO2 98%   Visual Acuity Right Eye Distance:   Left Eye Distance:   Bilateral Distance:    Right Eye Near:   Left Eye Near:    Bilateral Near:  Physical Exam Vitals and nursing note reviewed.  Constitutional:      Appearance: Normal appearance. She is not ill-appearing.  HENT:     Head: Atraumatic.     Mouth/Throat:     Mouth: Mucous membranes are moist.  Eyes:     Extraocular Movements: Extraocular movements intact.     Conjunctiva/sclera: Conjunctivae normal.  Cardiovascular:     Rate and Rhythm: Normal rate.  Pulmonary:     Effort: Pulmonary effort is normal.  Genitourinary:    Comments: GU exam deferred, self swab performed Musculoskeletal:        General: Normal range of motion.     Cervical back: Normal range of motion and neck supple.  Skin:    General: Skin is warm and dry.  Neurological:     Mental Status: She is alert and oriented to person, place, and time.  Psychiatric:        Mood and Affect: Mood normal.        Thought Content: Thought content normal.        Judgment: Judgment normal.      UC Treatments / Results  Labs (all labs ordered are listed, but only abnormal results are displayed) Labs Reviewed  POCT URINALYSIS DIP (MANUAL ENTRY)  POCT URINE PREGNANCY  CERVICOVAGINAL ANCILLARY ONLY    EKG   Radiology No results  found.  Procedures Procedures (including critical care time)  Medications Ordered in UC Medications - No data to display  Initial Impression / Assessment and Plan / UC Course  I have reviewed the triage vital signs and the nursing notes.  Pertinent labs & imaging results that were available during my care of the patient were reviewed by me and considered in my medical decision making (see chart for details).     Vaginal swab pending, urinalysis and urine pregnancy both negative today.  Discussed probiotics, boric acid suppositories and unscented soaps only while awaiting results and will adjust if needed.  She states she gets yeast infections with antibiotics so if an antibiotic needs to be called in is requesting to have a Diflucan  also.  Final Clinical Impressions(s) / UC Diagnoses   Final diagnoses:  Acute vaginitis   Discharge Instructions   None    ED Prescriptions   None    PDMP not reviewed this encounter.   Corbin Dess, [place] 08/27/23 1457

## 2023-08-28 LAB — CERVICOVAGINAL ANCILLARY ONLY
Bacterial Vaginitis (gardnerella): NEGATIVE
Candida Glabrata: NEGATIVE
Candida Vaginitis: NEGATIVE
Chlamydia: NEGATIVE
Comment: NEGATIVE
Comment: NEGATIVE
Comment: NEGATIVE
Comment: NEGATIVE
Comment: NEGATIVE
Comment: NORMAL
Neisseria Gonorrhea: NEGATIVE
Trichomonas: NEGATIVE

## 2023-12-17 ENCOUNTER — Ambulatory Visit (INDEPENDENT_AMBULATORY_CARE_PROVIDER_SITE_OTHER): Admitting: Family Medicine

## 2023-12-17 ENCOUNTER — Encounter: Payer: Self-pay | Admitting: Family Medicine

## 2023-12-17 ENCOUNTER — Ambulatory Visit: Payer: Self-pay

## 2023-12-17 ENCOUNTER — Ambulatory Visit: Admitting: Family Medicine

## 2023-12-17 VITALS — BP 109/69 | HR 68 | Temp 97.9°F | Ht 67.0 in | Wt 257.8 lb

## 2023-12-17 DIAGNOSIS — J029 Acute pharyngitis, unspecified: Secondary | ICD-10-CM

## 2023-12-17 DIAGNOSIS — B370 Candidal stomatitis: Secondary | ICD-10-CM

## 2023-12-17 MED ORDER — CLOTRIMAZOLE 10 MG MT TROC
OROMUCOSAL | 2 refills | Status: DC
Start: 2023-12-17 — End: 2024-01-22

## 2023-12-17 MED ORDER — AMOXICILLIN 875 MG PO TABS
875.0000 mg | ORAL_TABLET | Freq: Two times a day (BID) | ORAL | 0 refills | Status: AC
Start: 2023-12-17 — End: 2023-12-27

## 2023-12-17 NOTE — Telephone Encounter (Signed)
 Pt has appt

## 2023-12-17 NOTE — Telephone Encounter (Signed)
  FYI Only or Action Required?: FYI only for provider.  Patient was last seen in primary care on 05/13/2023 by Gladis Mustard, FNP.  Called Nurse Triage reporting Sore Throat.  Symptoms began several days ago.  Interventions attempted: Nothing.  Symptoms are: gradually worsening.  Triage Disposition: See Physician Within 24 Hours  Patient/caregiver understands and will follow disposition?: Yes    Copied from CRM [id number]. Topic: Clinical - Red Word Triage >> Dec 17, 2023  8:04 AM Everette C wrote: [facility] that prompted transfer to Nurse Triage: The patient is currently experiencing a sore throat and swollen tongue Reason for Disposition  SEVERE throat pain (e.g., excruciating)  Answer Assessment - Initial Assessment Questions 1. ONSET: When did the throat start hurting? (Hours or days ago)      Two days ago 2. SEVERITY: How bad is the sore throat? (Scale 1-10; mild, moderate or severe)     Sore, red, blisters, moderate 3. STREP EXPOSURE: Has there been any exposure to strep within the past week? If Yes, ask: What type of contact occurred?      no 4.  VIRAL SYMPTOMS: Are there any symptoms of a cold, such as a runny nose, cough, hoarse voice or red eyes?      I don't know 5. FEVER: Do you have a fever? If Yes, ask: What is your temperature, how was it measured, and when did it start?     Yes, 100 F 6. PUS ON THE TONSILS: Is there pus on the tonsils in the back of your throat?     unknown 7. OTHER SYMPTOMS: Do you have any other symptoms? (e.g., difficulty breathing, headache, rash)     headache 8. PREGNANCY: Is there any chance you are pregnant? When was your last menstrual period?     na  Protocols used: Sore Throat-A-AH

## 2023-12-17 NOTE — Progress Notes (Signed)
 Chief Complaint  Patient presents with   Sore Throat   History of Present Illness   Peggy Ford is a 28 year old female who presents with a sore throat and tongue discomfort.  She woke up on Sunday morning with a sore throat that persisted throughout the day and into the night. By the evening, her tongue appeared raw and painful to touch. Last night, she noticed blisters on her throat, which resolved by this morning.  No fever since the onset of symptoms. Despite the sore throat, she has been able to eat without significant difficulty, and swallowing does not cause much pain.  She feels generally tired but slept well last night. Slight nasal congestion is present, but there is no significant cough or other respiratory symptoms.       PMH: Smoking status noted Review of Systems  Objective: BP 109/69   Pulse 68   Temp 97.9 F (36.6 C)   Ht 5' 7 (1.702 m)   Wt 257 lb 12.8 oz (116.9 kg)   SpO2 98%   BMI 40.38 kg/m  Gen: NAD, alert, cooperative with exam HEENT: NCAT, Nasal passages swollen,  CV: RRR, good S1/S2, no murmur Resp: Bronchitis changes with scattered wheezes, non-labored Ext: No edema, warm Neuro: Alert and oriented, No gross deficits Physical Exam   HEENT: Throat erythematous. Tongue with grayish plaque, possible yeast. Ears normal. Nasal passages swollen, inflamed, congested. CHEST: Lungs clear to auscultation bilaterally. CARDIOVASCULAR: Heart regular rate and rhythm, no murmurs.      Thrush -     Clotrimazole ; Allow one to dissolve in the mouth 5 times daily For yeast  Dispense: 35 tablet; Refill: 2  Pharyngitis, unspecified etiology -     Amoxicillin ; Take 1 tablet (875 mg total) by mouth 2 (two) times daily for 10 days.  Dispense: 20 tablet; Refill: 0

## 2024-01-22 ENCOUNTER — Ambulatory Visit (INDEPENDENT_AMBULATORY_CARE_PROVIDER_SITE_OTHER): Admitting: Family Medicine

## 2024-01-22 ENCOUNTER — Ambulatory Visit: Payer: Self-pay | Admitting: Family Medicine

## 2024-01-22 VITALS — BP 114/78 | HR 76 | Temp 98.2°F | Ht 67.0 in | Wt 253.4 lb

## 2024-01-22 DIAGNOSIS — Z130 Encounter for screening for diseases of the blood and blood-forming organs and certain disorders involving the immune mechanism: Secondary | ICD-10-CM

## 2024-01-22 DIAGNOSIS — Z13228 Encounter for screening for other metabolic disorders: Secondary | ICD-10-CM

## 2024-01-22 DIAGNOSIS — Z23 Encounter for immunization: Secondary | ICD-10-CM | POA: Diagnosis not present

## 2024-01-22 DIAGNOSIS — Z13220 Encounter for screening for lipoid disorders: Secondary | ICD-10-CM

## 2024-01-22 DIAGNOSIS — Z113 Encounter for screening for infections with a predominantly sexual mode of transmission: Secondary | ICD-10-CM

## 2024-01-22 DIAGNOSIS — D72829 Elevated white blood cell count, unspecified: Secondary | ICD-10-CM

## 2024-01-22 DIAGNOSIS — R1013 Epigastric pain: Secondary | ICD-10-CM

## 2024-01-22 DIAGNOSIS — E669 Obesity, unspecified: Secondary | ICD-10-CM

## 2024-01-22 DIAGNOSIS — R109 Unspecified abdominal pain: Secondary | ICD-10-CM | POA: Diagnosis not present

## 2024-01-22 DIAGNOSIS — B3731 Acute candidiasis of vulva and vagina: Secondary | ICD-10-CM

## 2024-01-22 LAB — MICROSCOPIC EXAMINATION: Renal Epithel, UA: NONE SEEN /HPF

## 2024-01-22 LAB — URINALYSIS, ROUTINE W REFLEX MICROSCOPIC
Bilirubin, UA: NEGATIVE
Glucose, UA: NEGATIVE
Leukocytes,UA: NEGATIVE
Nitrite, UA: NEGATIVE
Specific Gravity, UA: >=1.03 — AB (ref 1.005–1.030)
Urobilinogen, Ur: 0.2 mg/dL (ref 0.2–1.0)
pH, UA: 6 (ref 5.0–7.5)

## 2024-01-22 LAB — BAYER DCA HB A1C WAIVED: HB A1C (BAYER DCA - WAIVED): 4.9 % (ref 4.8–5.6)

## 2024-01-22 LAB — WET PREP FOR TRICH, YEAST, CLUE
Clue Cell Exam: NEGATIVE
Trichomonas Exam: NEGATIVE
Yeast Exam: POSITIVE — AB

## 2024-01-22 MED ORDER — FLUCONAZOLE 150 MG PO TABS: 150.0000 mg | ORAL_TABLET | Freq: Once | ORAL | 0 refills | Status: AC

## 2024-01-22 MED ORDER — PANTOPRAZOLE SODIUM 40 MG PO TBEC: 40.0000 mg | DELAYED_RELEASE_TABLET | Freq: Every day | ORAL | 1 refills | Status: DC

## 2024-01-22 MED ORDER — COVID-19 MRNA VAC-TRIS(PFIZER) 30 MCG/0.3ML IM SUSY: 0.3000 mL | PREFILLED_SYRINGE | Freq: Once | INTRAMUSCULAR | 0 refills | Status: AC

## 2024-01-22 NOTE — Progress Notes (Signed)
 Subjective: RR:floupeoz concerns PCP: Peggy Peggy HERO, DO YEP:Fzhjw VENICE Ford is a 28 y.o. female presenting to clinic today for:  Discussed the use of AI scribe software for clinical note transcription with the patient, who gave verbal consent to proceed.  History of Present Illness     Peggy Ford is a 28 year old female who presents with right flank pain and gastrointestinal symptoms.  She has been lost to follow-up with PCP since 2023.  She has been experiencing right flank pain for approximately two months, describing it as tender to touch, especially with applied pressure. She recalls similar pain a couple of years ago, which was associated with eating. At that time, she underwent a HIDA scan, CT scan, endoscopy, and colonoscopy, which were unremarkable except for polyps found during the colonoscopy. She was told to repeat her colonoscopy every five years.  She denies any changes in urination, such as blood in the urine, cloudiness, or increased frequency. She is currently on a five-day course of oral metronidazole , which she started on Monday, and has an IUD in place for contraception.  She reports worsening gastrointestinal symptoms, including nausea, bloating, early satiety, diarrhea, and heartburn. She takes over-the-counter medications for heartburn, mentioning previous use of Pepcid, and recalls being on Nexium but found over-the-counter options more effective.  She has had unprotected sex outside of her monogamous relationship and is concerned about potential STIs. No pelvic pain, abnormal vaginal bleeding, or bleeding after intercourse.      ROS: Per HPI  No Known Allergies Past Medical History:  Diagnosis Date   Abdominal pain     Current Outpatient Medications:    COVID-19 mRNA vaccine, Pfizer, (COMIRNATY) syringe, Inject 0.3 mLs into the muscle once for 1 dose., Disp: 0.3 mL, Rfl: 0   fluconazole  (DIFLUCAN ) 150 MG tablet, Take 1 tablet (150 mg total) by  mouth once for 1 dose., Disp: 1 tablet, Rfl: 0   levonorgestrel  (MIRENA ) 20 MCG/DAY IUD, 1 each by Intrauterine route once., Disp: , Rfl:    metroNIDAZOLE  (FLAGYL ) 500 MG tablet, Take 500 mg by mouth 2 (two) times daily., Disp: , Rfl:    pantoprazole  (PROTONIX ) 40 MG tablet, Take 1 tablet (40 mg total) by mouth daily., Disp: 30 tablet, Rfl: 1 Social History   Socioeconomic History   Marital status: Single    Spouse name: Not on file   Number of children: Not on file   Years of education: Not on file   Highest education level: Some college, no degree  Occupational History   Not on file  Tobacco Use   Smoking status: Never   Smokeless tobacco: Never  Vaping Use   Vaping status: Never Used  Substance and Sexual Activity   Alcohol use: No    Alcohol/week: 0.0 standard drinks of alcohol   Drug use: No   Sexual activity: Never    Birth control/protection: I.U.D.  Other Topics Concern   Not on file  Social History Narrative   Not on file   Social Drivers of Health   Financial Resource Strain: Patient Declined (01/22/2024)   Overall Financial Resource Strain (CARDIA)    Difficulty of Paying Living Expenses: Patient declined  Food Insecurity: Patient Declined (01/22/2024)   Hunger Vital Sign    Worried About Running Out of Food in the Last Year: Patient declined    Ran Out of Food in the Last Year: Patient declined  Transportation Needs: No Transportation Needs (01/22/2024)   PRAPARE - Transportation  Lack of Transportation (Medical): No    Lack of Transportation (Non-Medical): No  Physical Activity: Insufficiently Active (01/22/2024)   Exercise Vital Sign    Days of Exercise per Week: 3 days    Minutes of Exercise per Session: 10 min  Stress: Stress Concern Present (01/22/2024)   [employer] of Occupational Health - Occupational Stress Questionnaire    Feeling of Stress: To some extent  Social Connections: Socially Isolated (01/22/2024)   Social Connection and Isolation  Panel    Frequency of Communication with Friends and Family: Three times a week    Frequency of Social Gatherings with Friends and Family: Twice a week    Attends Religious Services: Patient declined    Database administrator or Organizations: No    Attends Engineer, structural: Not on file    Marital Status: Never married  Catering manager Violence: Not on file   Family History  Problem Relation Age of Onset   Heart disease Other        great gm   Diabetes Other        great gf   Colon cancer Other        great gm   Irritable bowel syndrome Maternal Grandmother    Diverticulosis Paternal Grandfather     Objective: Office vital signs reviewed. BP 114/78   Pulse 76   Temp 98.2 F (36.8 C)   Ht 5' 7 (1.702 m)   Wt 253 lb 6 oz (114.9 kg)   LMP 01/22/2024   SpO2 97%   BMI 39.68 kg/m   Physical Examination:  General: Awake, alert, morbidly obese female, No acute distress HEENT: sclera white, MMM Cardio: regular rate and rhythm, S1S2 heard, no murmurs appreciated Pulm: clear to auscultation bilaterally, no wheezes, rhonchi or rales; normal work of breathing on room air GI: soft, +epigastric and RUQ TTP, non-distended, bowel sounds present x4, no hepatomegaly, no splenomegaly, no masses GU: CVA TTP on right present. No suprapubic TTP  Results for orders placed or performed in visit on 01/22/24 (from the past 24 hours)  Bayer DCA Hb A1c Waived     Status: None   Collection Time: 01/22/24 10:58 AM  Result Value Ref Range   HB A1C (BAYER DCA - WAIVED) 4.9 4.8 - 5.6 %   Narrative   Performed at:  [address] - [facility] [address], [place], [place]  [id number] Lab Director: Stefano Sprang [facility], Phone:  [phone]  Urinalysis, Routine w reflex microscopic     Status: Abnormal   Collection Time: 01/22/24 11:01 AM  Result Value Ref Range   Specific Gravity, UA      >=1.030 (A) 1.005 - 1.030   pH, UA 6.0 5.0 - 7.5   Color, UA Yellow Yellow   Appearance Ur Clear  Clear   Leukocytes,UA Negative Negative   Protein,UA Trace (A) Negative/Trace   Glucose, UA Negative Negative   Ketones, UA Trace (A) Negative   RBC, UA 2+ (A) Negative   Bilirubin, UA Negative Negative   Urobilinogen, Ur 0.2 0.2 - 1.0 mg/dL   Nitrite, UA Negative Negative   Microscopic Examination See below:    Narrative   Performed at:  [address] - [facility] [address] [place], [place]  [id number] Lab Director: Stefano Sprang [facility], Phone:  [phone]  WET PREP FOR TRICH, YEAST, CLUE     Status: Abnormal   Collection Time: 01/22/24 11:01 AM   Specimen: Vaginal Swab   Vaginal Swab  Result  Value Ref Range   Trichomonas Exam Negative Negative   Yeast Exam Positive (A) Negative   Clue Cell Exam Negative Negative   Narrative   Performed at:  [address] - [facility] [address] [place], [place]  [id number] Lab Director: Stefano Sprang [facility], Phone:  [phone]  Microscopic Examination     Status: Abnormal   Collection Time: 01/22/24 11:01 AM   Urine  Result Value Ref Range   WBC, UA 0-5 0 - 5 /hpf   RBC, Urine 0-2 0 - 2 /hpf   Epithelial Cells (non renal) 0-10 0 - 10 /hpf   Renal Epithel, UA None seen None seen /hpf   Mucus, UA Present (A) Not Estab.   Bacteria, UA Moderate (A) None seen/Few   Yeast, UA Present (A) None seen   Narrative   Performed at:  [address] - [facility] [address] [place], [place]  [id number] Lab Director: Stefano Sprang [facility], Phone:  [phone]     Assessment/ Plan: 28 y.o. female   Right flank pain - Plan: Urinalysis, Routine w reflex microscopic, Microscopic Examination  Postprandial epigastric pain - Plan: pantoprazole  (PROTONIX ) 40 MG tablet, Ambulatory referral to Gastroenterology  Screening examination for STI - Plan: HIV antibody (with reflex), Hepatitis C antibody, RPR, HSV(herpes simplex vrs) 1+2 ab-IgG, Ct Ng M genitalium NAA, Urine, WET PREP FOR TRICH, YEAST, CLUE, HSV 1 and 2 Ab, IgG  Obesity (BMI 35.0-39.9 without  comorbidity) - Plan: CMP14+EGFR, Lipid Panel, Bayer DCA Hb A1c Waived, TSH, VITAMIN D  25 Hydroxy (Vit-D Deficiency, Fractures), COVID-19 mRNA vaccine, Pfizer, (COMIRNATY) syringe  Screening, lipid - Plan: Lipid Panel  Screening for metabolic disorder - Plan: CMP14+EGFR, TSH  Screening, anemia, deficiency, iron - Plan: CBC with Differential  Need for vaccination - Plan: COVID-19 mRNA vaccine, Pfizer, (COMIRNATY) syringe, Flu vaccine trivalent PF, 6mos and older(Flulaval,Afluria,Fluarix,Fluzone), Tdap vaccine greater than or equal to 7yo IM, HPV 9-valent vaccine,Recombinat  Yeast vaginitis - Plan: fluconazole  (DIFLUCAN ) 150 MG tablet  Assessment and Plan    Right flank and abdominal pain with gastrointestinal symptoms Differential includes musculoskeletal pain, peptic ulcer disease, hiatal hernia, gastroparesis, and possible duodenal ulcer. Previous evaluations unremarkable. Symptoms suggest peptic ulcer disease. - Order complete metabolic panel and urinalysis. - Prescribe pantoprazole . - Consider Voquezna if PPI not effective.  has already tried Pepcid - Refer back to gastroenterology for possible EGD and swallow study.  She is overdue for colonoscopy - Consider imaging if blood work and urine analysis are inconclusive. UA without evidence of infection at this time.  Screening for sexually transmitted infections Request for STI screening following unprotected intercourse. - Order urine test for gonorrhea and chlamydia. - Order self-swab for trichomonas. - Order blood tests for HIV, hepatitis, HSV1/2 and syphilis. - CBC - no evidence of BV but yeast present. Diflucan  sent - PID could be cause of abdominal discomfort  Obesity BMI >35 CMP, lipid, A1c, Vit D - Covid vaccine ordered  General Health Maintenance Discussed need for COVID-19 vaccination. Informed vaccines not available at this location and provided guidance on where to obtain them. - Advise on obtaining COVID-19  vaccination at an external location. - Needs to schedule CPE       Peggy CHRISTELLA Fielding, DO [facility] [phone]

## 2024-01-23 LAB — HSV 1 AND 2 AB, IGG
HSV 1 Glycoprotein G Ab, IgG: REACTIVE — AB
HSV 2 IgG, Type Spec: NONREACTIVE

## 2024-01-23 LAB — CT NG M GENITALIUM NAA, URINE

## 2024-01-25 LAB — LIPID PANEL
Chol/HDL Ratio: 3.2 ratio (ref 0.0–4.4)
Cholesterol, Total: 145 mg/dL (ref 100–199)
HDL: 45 mg/dL (ref >39)
LDL Chol Calc (NIH): 77 mg/dL (ref 0–99)
Triglycerides: 132 mg/dL (ref 0–149)
VLDL Cholesterol Cal: 23 mg/dL (ref 5–40)

## 2024-01-25 LAB — CBC WITH DIFFERENTIAL/PLATELET
Basophils Absolute: 0.1 x10E3/uL (ref 0.0–0.2)
Basos: 0 %
EOS (ABSOLUTE): 0.1 x10E3/uL (ref 0.0–0.4)
Eos: 1 %
Hematocrit: 40.9 % (ref 34.0–46.6)
Hemoglobin: 13.1 g/dL (ref 11.1–15.9)
Immature Grans (Abs): 0 x10E3/uL (ref 0.0–0.1)
Immature Granulocytes: 0 %
Lymphocytes Absolute: 3 x10E3/uL (ref 0.7–3.1)
Lymphs: 26 %
MCH: 29.6 pg (ref 26.6–33.0)
MCHC: 32 g/dL (ref 31.5–35.7)
MCV: 92 fL (ref 79–97)
Monocytes Absolute: 0.9 x10E3/uL (ref 0.1–0.9)
Monocytes: 8 %
Neutrophils Absolute: 7.6 x10E3/uL — ABNORMAL HIGH (ref 1.4–7.0)
Neutrophils: 65 %
Platelets: 302 x10E3/uL (ref 150–450)
RBC: 4.43 x10E6/uL (ref 3.77–5.28)
RDW: 13 % (ref 11.7–15.4)
WBC: 11.7 x10E3/uL — ABNORMAL HIGH (ref 3.4–10.8)

## 2024-01-25 LAB — CMP14+EGFR
ALT: 16 IU/L (ref 0–32)
AST: 21 IU/L (ref 0–40)
Albumin: 4 g/dL (ref 4.0–5.0)
Alkaline Phosphatase: 62 IU/L (ref 41–116)
BUN/Creatinine Ratio: 13 (ref 9–23)
BUN: 10 mg/dL (ref 6–20)
Bilirubin Total: 0.3 mg/dL (ref 0.0–1.2)
CO2: 21 mmol/L (ref 20–29)
Calcium: 9 mg/dL (ref 8.7–10.2)
Chloride: 107 mmol/L — ABNORMAL HIGH (ref 96–106)
Creatinine, Ser: 0.8 mg/dL (ref 0.57–1.00)
Globulin, Total: 2.4 g/dL (ref 1.5–4.5)
Glucose: 73 mg/dL (ref 70–99)
Potassium: 3.9 mmol/L (ref 3.5–5.2)
Sodium: 143 mmol/L (ref 134–144)
Total Protein: 6.4 g/dL (ref 6.0–8.5)
eGFR: 102 mL/min/1.73

## 2024-01-25 LAB — HEPATITIS C ANTIBODY: Hep C Virus Ab: NONREACTIVE

## 2024-01-25 LAB — HIV ANTIBODY (ROUTINE TESTING W REFLEX): HIV Screen 4th Generation wRfx: NONREACTIVE

## 2024-01-25 LAB — TSH: TSH: 3.05 u[IU]/mL (ref 0.450–4.500)

## 2024-01-25 LAB — RPR: RPR Ser Ql: NONREACTIVE

## 2024-01-25 LAB — VITAMIN D 25 HYDROXY (VIT D DEFICIENCY, FRACTURES): Vit D, 25-Hydroxy: 26.7 ng/mL — ABNORMAL LOW (ref 30.0–100.0)

## 2024-02-18 NOTE — Telephone Encounter (Signed)
 LMTCB to schedule appt

## 2024-03-04 ENCOUNTER — Encounter: Payer: Self-pay | Admitting: Family Medicine

## 2024-03-04 ENCOUNTER — Ambulatory Visit: Payer: Self-pay | Admitting: Family Medicine

## 2024-03-04 ENCOUNTER — Ambulatory Visit (INDEPENDENT_AMBULATORY_CARE_PROVIDER_SITE_OTHER): Admitting: Family Medicine

## 2024-03-04 ENCOUNTER — Other Ambulatory Visit (HOSPITAL_COMMUNITY)
Admission: RE | Admit: 2024-03-04 | Discharge: 2024-03-04 | Disposition: A | Discharge status: Home / Self Care | Source: Ambulatory Visit | Attending: Family Medicine | Admitting: Family Medicine

## 2024-03-04 VITALS — BP 114/66 | HR 68 | Temp 98.5°F | Ht 67.0 in | Wt 253.2 lb

## 2024-03-04 DIAGNOSIS — Z124 Encounter for screening for malignant neoplasm of cervix: Secondary | ICD-10-CM | POA: Diagnosis present

## 2024-03-04 DIAGNOSIS — B9689 Other specified bacterial agents as the cause of diseases classified elsewhere: Secondary | ICD-10-CM | POA: Diagnosis not present

## 2024-03-04 DIAGNOSIS — N760 Acute vaginitis: Secondary | ICD-10-CM | POA: Diagnosis not present

## 2024-03-04 DIAGNOSIS — Z113 Encounter for screening for infections with a predominantly sexual mode of transmission: Secondary | ICD-10-CM | POA: Insufficient documentation

## 2024-03-04 LAB — WET PREP FOR TRICH, YEAST, CLUE
Clue Cell Exam: POSITIVE — AB
Trichomonas Exam: NEGATIVE
Yeast Exam: NEGATIVE

## 2024-03-04 MED ORDER — METRONIDAZOLE 500 MG PO TABS: 500.0000 mg | ORAL_TABLET | Freq: Two times a day (BID) | ORAL | 0 refills | Status: AC

## 2024-03-04 MED ORDER — FLUCONAZOLE 150 MG PO TABS
150.0000 mg | ORAL_TABLET | Freq: Once | ORAL | 0 refills | Status: AC
Start: 2024-03-04 — End: 2024-03-04

## 2024-03-04 NOTE — Progress Notes (Signed)
 Subjective: CC: pap PCP: Jolinda Peggy HERO, DO YEP:Fzhjw Peggy Ford is a 28 y.o. female presenting to clinic today for:  Here for Pap smear but actually reports that she has been having some vaginal discharge and itching.  Denies any smells or yeasty appearance but may be a yeast infection.  She notes this occurred after she had intercourse and may have transmitted some bacteria from the rectum to the vagina.  She has IUD in place for contraception.  Does report some mood lability and wonders if perhaps she is needing to have this exchanged out. Patient's last menstrual period was 02/24/2024.    ROS: Per HPI  No Known Allergies Past Medical History:  Diagnosis Date   Abdominal pain     Current Outpatient Medications:    levonorgestrel  (MIRENA ) 20 MCG/DAY IUD, 1 each by Intrauterine route once., Disp: , Rfl:  Social History   Socioeconomic History   Marital status: Single    Spouse name: Not on file   Number of children: Not on file   Years of education: Not on file   Highest education level: Some college, no degree  Occupational History   Not on file  Tobacco Use   Smoking status: Never   Smokeless tobacco: Never  Vaping Use   Vaping status: Never Used  Substance and Sexual Activity   Alcohol use: No    Alcohol/week: 0.0 standard drinks of alcohol   Drug use: No   Sexual activity: Never    Birth control/protection: I.U.D.  Other Topics Concern   Not on file  Social History Narrative   Not on file   Social Drivers of Health   Financial Resource Strain: Patient Declined (01/22/2024)   Overall Financial Resource Strain (CARDIA)    Difficulty of Paying Living Expenses: Patient declined  Food Insecurity: Patient Declined (01/22/2024)   Hunger Vital Sign    Worried About Running Out of Food in the Last Year: Patient declined    Ran Out of Food in the Last Year: Patient declined  Transportation Needs: No Transportation Needs (01/22/2024)   PRAPARE - Therapist, Art (Medical): No    Lack of Transportation (Non-Medical): No  Physical Activity: Insufficiently Active (01/22/2024)   Exercise Vital Sign    Days of Exercise per Week: 3 days    Minutes of Exercise per Session: 10 min  Stress: Stress Concern Present (01/22/2024)   [employer] of Occupational Health - Occupational Stress Questionnaire    Feeling of Stress: To some extent  Social Connections: Socially Isolated (01/22/2024)   Social Connection and Isolation Panel    Frequency of Communication with Friends and Family: Three times a week    Frequency of Social Gatherings with Friends and Family: Twice a week    Attends Religious Services: Patient declined    Database Administrator or Organizations: No    Attends Engineer, Structural: Not on file    Marital Status: Never married  Intimate Partner Violence: Not At Risk (01/24/2024)   Received from [facility]   HITS    Over the last 12 months how often did your partner physically hurt you?: Never    Over the last 12 months how often did your partner insult you or talk down to you?: Never    Over the last 12 months how often did your partner threaten you with physical harm?: Never    Over the last 12 months how often did your partner scream or  curse at you?: Never   Family History  Problem Relation Age of Onset   Heart disease Other        great gm   Diabetes Other        great gf   Colon cancer Other        great gm   Irritable bowel syndrome Maternal Grandmother    Diverticulosis Paternal Grandfather     Objective: Office vital signs reviewed. BP 114/66   Pulse 68   Temp 98.5 F (36.9 C)   Ht 5' 7 (1.702 m)   Wt 253 lb 4 oz (114.9 kg)   LMP 02/24/2024   SpO2 95%   BMI 39.66 kg/m   Physical Examination:  General: Awake, alert, well nourished, No acute distress GU: external vaginal tissue normal, cervix midline and high, no punctate lesions on cervix appreciated, moderate discharge from  cervical os, no bleeding, no cervical motion tenderness, no abdominal/ adnexal masses  No results found for this or any previous visit (from the past 24 hours).  Assessment/ Plan: 28 y.o. female   Bacterial vaginosis - Plan: WET PREP FOR TRICH, YEAST, CLUE, metroNIDAZOLE  (FLAGYL ) 500 MG tablet  Screening for malignant neoplasm of cervix - Plan: Cytology - PAP   Wet prep consistent with bacterial vaginosis.  Flagyl  sent.  No yeast.  Pap smear with cotesting for GC chlamydia placed.   Peggy CHRISTELLA Fielding, DO [facility] [phone]

## 2024-03-06 ENCOUNTER — Encounter: Payer: Self-pay | Admitting: Family Medicine

## 2024-03-06 LAB — CYTOLOGY - PAP
Chlamydia: NEGATIVE
Comment: NEGATIVE
Comment: NORMAL
Neisseria Gonorrhea: NEGATIVE

## 2024-03-17 ENCOUNTER — Telehealth: Payer: Self-pay | Admitting: *Deleted

## 2024-03-17 ENCOUNTER — Telehealth: Payer: Self-pay

## 2024-03-17 NOTE — Telephone Encounter (Signed)
 RN attempted to call patient regarding questions she had about pain management if were to get IUD. RN left HIPAA compliant voicemail to return RN call.  Silvano LELON Piano, RN

## 2024-03-17 NOTE — Telephone Encounter (Signed)
 Before she schedules an appointment for an IUD insertion as a New GYN, she would like some questions answered. Wants to know what the providers use before and or during procedure for pain control and her options. Please call her with that information. Thank you.

## 2024-03-20 ENCOUNTER — Ambulatory Visit: Payer: Self-pay

## 2024-03-20 ENCOUNTER — Ambulatory Visit
Admission: RE | Admit: 2024-03-20 | Discharge: 2024-03-20 | Disposition: A | Discharge status: Home / Self Care | Attending: Family Medicine | Admitting: Family Medicine

## 2024-03-20 ENCOUNTER — Telehealth: Payer: Self-pay | Admitting: Family Medicine

## 2024-03-20 VITALS — BP 117/76 | HR 69 | Temp 98.5°F | Resp 16

## 2024-03-20 DIAGNOSIS — N390 Urinary tract infection, site not specified: Secondary | ICD-10-CM | POA: Insufficient documentation

## 2024-03-20 LAB — POCT URINE DIPSTICK
Bilirubin, UA: NEGATIVE
Glucose, UA: NEGATIVE mg/dL
Ketones, POC UA: NEGATIVE mg/dL
Nitrite, UA: NEGATIVE
POC PROTEIN,UA: NEGATIVE
Spec Grav, UA: 1.01 (ref 1.010–1.025)
Urobilinogen, UA: 0.2 U/dL
pH, UA: 6 (ref 5.0–8.0)

## 2024-03-20 MED ORDER — CEPHALEXIN 500 MG PO CAPS: 500.0000 mg | ORAL_CAPSULE | Freq: Two times a day (BID) | ORAL | 0 refills | Status: AC

## 2024-03-20 NOTE — Telephone Encounter (Signed)
 Patient going to urgent care.

## 2024-03-20 NOTE — Telephone Encounter (Signed)
 Copied from CRM #[id number]. Topic: General - Other >> Mar 20, 2024 10:55 AM Travis F wrote: Reason for CRM: Patient's mother is calling in returning a call from the office. Please follow up with patient's mother.

## 2024-03-20 NOTE — Telephone Encounter (Signed)
 FYI Only or Action Required?: FYI only for provider: recommended to Urgent Care to be seen.  Patient was last seen in primary care on 03/04/2024 by Jolinda Norene HERO, DO.  Called Nurse Triage reporting Urinary Frequency.  Symptoms began several days ago.  Interventions attempted: Rest, hydration, or home remedies.  Symptoms are: unchanged.  Triage Disposition: See HCP Within 4 Hours (Or PCP Triage)  Patient/caregiver understands and will follow disposition?: Yes  Copied from CRM #[id number]. Topic: Clinical - Red Word Triage >> Mar 20, 2024  9:03 AM Donna BRAVO wrote: Red Word that prompted transfer to Nurse Triage: patient mother Silvano calling patient works 3rd shift  Symptoms: -Possable UTI -pain when urinating -urine is cloudy -frequently urinating -had it a couple of days >> Mar 20, 2024  9:19 AM Donna BRAVO wrote: Please call patient mom Silvano [phone]  Reason for Disposition  [1] SEVERE pain with urination (e.g., excruciating) AND [2] not improved after 2 hours of pain medicine  Answer Assessment - Initial Assessment Questions Patient's mom called for patient-endorses a couple of days of moderate to severe pain with urination. Endorses urinary frequency, cloudy urine. Patient recommended to urgent care as there are no appointments available in the office or area clinics.   1. SEVERITY: How bad is the pain?  (e.g., Scale 1-10; mild, moderate, or severe)     Moderate to severe 2. FREQUENCY: How many times have you had painful urination today?      Several times 3. PATTERN: Is pain present every time you urinate or just sometimes?      Yes every time  4. ONSET: When did the painful urination start?      Couple of days ago 5. FEVER: Do you have a fever? If Yes, ask: What is your temperature, how was it measured, and when did it start?     no 6. PAST UTI: Have you had a urine infection before? If Yes, ask: When was the last time? and What happened that  time?      yes 7. CAUSE: What do you think is causing the painful urination?  (e.g., UTI, scratch, Herpes sore)     Possible UTI 8. OTHER SYMPTOMS: Do you have any other symptoms? (e.g., blood in urine, flank pain, genital sores, urgency, vaginal discharge)     Cloudy urine, urinary frequency, pain with urination 9. PREGNANCY: Is there any chance you are pregnant? When was your last menstrual period?     no  Protocols used: Urination Pain - Female-A-AH

## 2024-03-20 NOTE — Telephone Encounter (Signed)
 Please see other telephone encounter.

## 2024-03-20 NOTE — ED Triage Notes (Signed)
 Pt states pelvic pain and bladder spasms after she urinates for the past 4 days.  Pt states she has been taking AZO at home.

## 2024-03-20 NOTE — ED Provider Notes (Signed)
 [facility]    CSN: [id number] Arrival date & time: 03/20/24  1308      History   Chief Complaint Chief Complaint  Patient presents with   Urinary Frequency    Suspected bladder infection. Burning and pain when peeing. Cloudy and smelly urine - Entered by patient    HPI Peggy Ford is a 28 y.o. female.   Patient presenting today with about 4-day history of suprapubic discomfort, dysuria.  Denies fever, chills, flank pain, nausea, vomiting, hematuria, vaginal symptoms.  So far trying Azo as needed with minimal relief.  No concern for STIs or pregnancy, has an IUD in place and LMP 03/09/2024.    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      Home Medications    Prior to Admission medications   Medication Sig Start Date End Date Taking? Authorizing Provider  cephALEXin  (KEFLEX ) 500 MG capsule Take 1 capsule (500 mg total) by mouth 2 (two) times daily. 03/20/24  Yes Stuart Vernell Norris, PA-C  levonorgestrel  (MIRENA ) 20 MCG/DAY IUD 1 each by Intrauterine route once.    [provider]    Family History Family History  Problem Relation Age of Onset   Heart disease Other        great gm   Diabetes Other        great gf   Colon cancer Other        great gm   Irritable bowel syndrome Maternal Grandmother    Diverticulosis Paternal Grandfather     Social History Social History   Tobacco Use   Smoking status: Never   Smokeless tobacco: Never  Vaping Use   Vaping status: Never Used  Substance Use Topics   Alcohol use: No    Alcohol/week: 0.0 standard drinks of alcohol   Drug use: No     Allergies   Patient has no known allergies.   Review of Systems Review of  Systems Per HPI  Physical Exam Triage Vital Signs ED Triage Vitals  Encounter Vitals Group     BP 03/20/24 1316 117/76     Girls Systolic BP Percentile --      Girls Diastolic BP Percentile --      Boys Systolic BP Percentile --      Boys Diastolic BP Percentile --      Pulse Rate 03/20/24 1316 69     Resp 03/20/24 1316 16     Temp 03/20/24 1316 98.5 F (36.9 C)     Temp Source 03/20/24 1316 Oral     SpO2 03/20/24 1316 98 %     Weight --      Height --      Head Circumference --      Peak Flow --      Pain Score 03/20/24 1315 6     Pain Loc --      Pain Education --      Exclude from Growth Chart --    No data found.  Updated Vital Signs BP 117/76 (BP Location: Right Arm)   Pulse 69   Temp 98.5 F (36.9 C) (Oral)   Resp 16   LMP  03/09/2024 (Exact Date)   SpO2 98%   Visual Acuity Right Eye Distance:   Left Eye Distance:   Bilateral Distance:    Right Eye Near:   Left Eye Near:    Bilateral Near:     Physical Exam Vitals and nursing note reviewed.  Constitutional:      Appearance: Normal appearance. She is not ill-appearing.  HENT:     Head: Atraumatic.  Eyes:     Extraocular Movements: Extraocular movements intact.     Conjunctiva/sclera: Conjunctivae normal.  Cardiovascular:     Rate and Rhythm: Normal rate.  Pulmonary:     Effort: Pulmonary effort is normal.  Abdominal:     General: Bowel sounds are normal. There is no distension.     Palpations: Abdomen is soft.     Tenderness: There is no abdominal tenderness. There is no right CVA tenderness, left CVA tenderness or guarding.  Genitourinary:    Comments: GU exam deferred, self swab performed Musculoskeletal:        General: Normal range of motion.     Cervical back: Normal range of motion and neck supple.  Skin:    General: Skin is warm and dry.  Neurological:     Mental Status: She is alert and oriented to person, place, and time.  Psychiatric:        Mood and Affect: Mood normal.         Thought Content: Thought content normal.        Judgment: Judgment normal.      UC Treatments / Results  Labs (all labs ordered are listed, but only abnormal results are displayed) Labs Reviewed  POCT URINE DIPSTICK - Abnormal; Notable for the following components:      Result Value   Clarity, UA cloudy (*)    Blood, UA moderate (*)    Leukocytes, UA Large (3+) (*)    All other components within normal limits  URINE CULTURE    EKG   Radiology No results found.  Procedures Procedures (including critical care time)  Medications Ordered in UC Medications - No data to display  Initial Impression / Assessment and Plan / UC Course  I have reviewed the triage vital signs and the nursing notes.  Pertinent labs & imaging results that were available during my care of the patient were reviewed by me and considered in my medical decision making (see chart for details).     Urinalysis today with evidence of a possible urinary tract infection.  Will treat with Keflex , urine culture pending and discussed fluids, supportive over-the-counter medications and home care.  Return for worsening or unresolving symptoms.  Work note given.  Final Clinical Impressions(s) / UC Diagnoses   Final diagnoses:  Acute lower UTI   Discharge Instructions   None    ED Prescriptions     Medication Sig Dispense Auth. Provider   cephALEXin  (KEFLEX ) 500 MG capsule Take 1 capsule (500 mg total) by mouth 2 (two) times daily. 10 capsule Stuart Vernell Norris, [place]      PDMP not reviewed this encounter.   Stuart Vernell Norris, [place] 03/20/24 1352

## 2024-03-21 ENCOUNTER — Telehealth: Payer: Self-pay | Admitting: Emergency Medicine

## 2024-03-21 MED ORDER — FLUCONAZOLE 150 MG PO TABS: 150.0000 mg | ORAL_TABLET | Freq: Every day | ORAL | 0 refills | Status: AC

## 2024-03-21 NOTE — Telephone Encounter (Signed)
 Pt called and inquired about diflucan  px due to hx of yeast infections while taking abx. Pt reports was px abx at yesterday's UC visit. Consulted PA, verbal order for diflucan  150 mg PO x1 dose. Pt verified [facility] [place] as pharmacy for prescription.

## 2024-03-22 LAB — URINE CULTURE: Culture: >=100000 — AB

## 2024-03-23 ENCOUNTER — Ambulatory Visit (HOSPITAL_COMMUNITY): Payer: Self-pay
